# Patient Record
Sex: Female | Born: 1945
Health system: Southern US, Community
[De-identification: ages and names within clinical notes are randomized; demographics above are authoritative.]

## PROBLEM LIST (undated history)

## (undated) DIAGNOSIS — K219 Gastro-esophageal reflux disease without esophagitis: Secondary | ICD-10-CM

## (undated) DIAGNOSIS — F32A Depression, unspecified: Secondary | ICD-10-CM

## (undated) DIAGNOSIS — F419 Anxiety disorder, unspecified: Secondary | ICD-10-CM

## (undated) DIAGNOSIS — M1990 Unspecified osteoarthritis, unspecified site: Secondary | ICD-10-CM

## (undated) DIAGNOSIS — M3500 Sicca syndrome, unspecified: Secondary | ICD-10-CM

## (undated) HISTORY — DX: Unspecified osteoarthritis, unspecified site: M19.90

## (undated) HISTORY — DX: Gastro-esophageal reflux disease without esophagitis: K21.9

## (undated) HISTORY — PX: TOTAL ABDOMINAL HYSTERECTOMY: SHX209

## (undated) HISTORY — DX: Sjogren syndrome, unspecified: M35.00

---

## 2002-10-17 ENCOUNTER — Ambulatory Visit (HOSPITAL_COMMUNITY): Admission: RE | Admit: 2002-10-17 | Discharge: 2002-10-17 | Payer: Self-pay | Admitting: Internal Medicine

## 2004-05-13 ENCOUNTER — Ambulatory Visit (HOSPITAL_COMMUNITY): Admission: RE | Admit: 2004-05-13 | Discharge: 2004-05-13 | Payer: Self-pay | Admitting: Internal Medicine

## 2004-06-18 ENCOUNTER — Ambulatory Visit (HOSPITAL_COMMUNITY): Admission: RE | Admit: 2004-06-18 | Discharge: 2004-06-18 | Payer: Self-pay | Admitting: Unknown Physician Specialty

## 2005-04-27 ENCOUNTER — Ambulatory Visit: Payer: Self-pay | Admitting: Internal Medicine

## 2006-11-25 ENCOUNTER — Ambulatory Visit: Payer: Self-pay | Admitting: Internal Medicine

## 2007-02-13 ENCOUNTER — Encounter: Admission: RE | Admit: 2007-02-13 | Discharge: 2007-02-13 | Payer: Self-pay | Admitting: Orthopedic Surgery

## 2007-03-11 ENCOUNTER — Ambulatory Visit: Payer: Self-pay | Admitting: Internal Medicine

## 2012-04-29 ENCOUNTER — Other Ambulatory Visit (INDEPENDENT_AMBULATORY_CARE_PROVIDER_SITE_OTHER): Payer: Self-pay | Admitting: Internal Medicine

## 2012-05-02 ENCOUNTER — Telehealth (INDEPENDENT_AMBULATORY_CARE_PROVIDER_SITE_OTHER): Payer: Self-pay | Admitting: *Deleted

## 2012-05-02 NOTE — Telephone Encounter (Addendum)
Please call patient's refill for Omeprazole in. She uses [facility] in [place] at [phone]. Novia scheduled an apt with Dorene Ar for 05/05/12.   This has been taken care of

## 2012-05-05 ENCOUNTER — Encounter (INDEPENDENT_AMBULATORY_CARE_PROVIDER_SITE_OTHER): Payer: Self-pay | Admitting: Internal Medicine

## 2012-05-05 ENCOUNTER — Ambulatory Visit (INDEPENDENT_AMBULATORY_CARE_PROVIDER_SITE_OTHER): Payer: MEDICARE | Admitting: Internal Medicine

## 2012-05-05 VITALS — BP 100/80 | HR 72 | Temp 98.5°F | Ht 64.0 in | Wt 166.5 lb

## 2012-05-05 DIAGNOSIS — K219 Gastro-esophageal reflux disease without esophagitis: Secondary | ICD-10-CM

## 2012-05-05 NOTE — Patient Instructions (Signed)
Continue Omeprazole daily. OV in 1 yr

## 2012-05-05 NOTE — Progress Notes (Signed)
Subjective:     Patient ID: Kayla Berg, female   DOB: 1946/09/26, 66 y.o.   MRN: [id number]  Kayla Berg is a 66 yr old female here today for f/u. She tells me her acid reflux is controlled with the Prilosec for the most part. She will have a flare of acid reflux if she eats Spaghetti.  Appetite is good. No weight loss. No abdominal pain. She usually has a BM x 1 day  No melena or bright red rectal bleeding.   Review of Systemssee hpi Current Outpatient Prescriptions  Medication Sig Dispense Refill  . calcium carbonate (OS-CAL) 600 MG TABS Take 600 mg by mouth 2 (two) times daily with a meal.      . chlordiazePOXIDE (LIBRIUM) 25 MG capsule Take 25 mg by mouth 3 (three) times daily as needed.      . cholecalciferol (VITAMIN D) 400 UNITS TABS Take by mouth.      Marland Kitchen FLUoxetine (PROZAC) 20 MG capsule Take 20 mg by mouth daily.      . hydroxychloroquine (PLAQUENIL) 200 MG tablet Take by mouth 2 (two) times daily before a meal.      . multivitamin-iron-minerals-folic acid (CENTRUM) chewable tablet Chew 1 tablet by mouth daily.       Past Medical History  Diagnosis Date  . GERD (gastroesophageal reflux disease)   . Arthritis    Past Surgical History  Procedure Date  . Total abdominal hysterectomy    History   Social History  . Marital Status: Married    Spouse Name: N/A    Number of Children: N/A  . Years of Education: N/A   Occupational History  . Not on file.   Social History Main Topics  . Smoking status: Never Smoker   . Smokeless tobacco: Not on file  . Alcohol Use: No  . Drug Use: No  . Sexually Active: Not on file   Other Topics Concern  . Not on file   Social History Narrative  . No narrative on file   Family Status  Relation Status Death Age  . Mother Deceased     CAD, DM  . Father Deceased     Alzheimer's  . Sister Alive     CAD, Kidney problems, HTN, DM  . Brother Deceased     One had lung cancer. One died from CVA, One is alive  with intestinal cancer    Allergies  Allergen Reactions  . Codeine   . Sulfa Antibiotics        Objective:   Physical Exam Filed Vitals:   05/05/12 1152  Height: 5\' 4"  (1.626 m)  Weight: 166 lb 8 oz (75.524 kg)  Alert and oriented. Skin warm and dry. Oral mucosa is moist.   . Sclera anicteric, conjunctivae is pink. Thyroid not enlarged. No cervical lymphadenopathy. Lungs clear. Heart regular rate and rhythm.  Abdomen is soft. Bowel sounds are positive. No hepatomegaly. No abdominal masses felt. No tenderness.  No edema to lower extremities. Patient is alert and oriented.       Assessment:    GERD well controlled this time with Omeprazole. No GI problems.    Plan:      OV in one year.

## 2013-05-03 ENCOUNTER — Other Ambulatory Visit: Payer: Self-pay | Admitting: *Deleted

## 2013-05-03 ENCOUNTER — Encounter: Payer: Self-pay | Admitting: Neurology

## 2013-05-03 ENCOUNTER — Ambulatory Visit (INDEPENDENT_AMBULATORY_CARE_PROVIDER_SITE_OTHER): Payer: Medicare Other | Admitting: Neurology

## 2013-05-03 VITALS — BP 130/82 | HR 78 | Temp 97.6°F | Ht 63.0 in | Wt 165.0 lb

## 2013-05-03 DIAGNOSIS — H8110 Benign paroxysmal vertigo, unspecified ear: Secondary | ICD-10-CM

## 2013-05-03 DIAGNOSIS — G3184 Mild cognitive impairment, so stated: Secondary | ICD-10-CM

## 2013-05-03 NOTE — Patient Instructions (Addendum)
I think overall you are doing fairly well but I do want to suggest a few things today:  As far as diagnostic testing: MRI brain and labs: blood work.  Remember to drink plenty of fluid, eat healthy meals and do not skip any meals. Try to eat protein with a every meal and eat a healthy snack such as fruit or nuts in between meals. Try to keep a regular sleep-wake schedule and try to exercise daily, particularly in the form of walking, 20-30 minutes a day, if you can.   Engage in social activities in your community and with your family and try to keep up with current events by reading the newspaper or watching the news.   As far as your medications are concerned, I would like to suggest no change.   I would like to see you back in 3 months, sooner if we need to. Please call us with any interim questions, concerns, problems, updates or refill requests.  Please also call us for any test results so we can go over those with you on the phone. Brett Canales is my clinical assistant and will answer any of your questions and relay your messages to me and also relay most of my messages to you.  Our phone number is [phone]. We also have an after hours call service for urgent matters and there is a physician on-call for urgent questions. For any emergencies you know to call 911 or go to the nearest emergency room.

## 2013-05-03 NOTE — Progress Notes (Signed)
Subjective:    Patient ID: Kayla Berg is a 67 y.o. female.  HPI  Huston Foley, MD, PhD [facility] [address] [address] [place], [place] [address]   Dear Dr. Margo Common,  I saw your patient, Kayla Berg, upon your kind request in my neurologic clinic today for initial consultation of her vertigo. The patient is unaccompanied today. As you know, Kayla Berg is a very pleasant 67 year old right-handed lady with an underlying medical history of Sjogren's disease diagnosed in November 1999, reflux disease, and arthritis, status post arthroscopic right knee surgery in July 2005, who has been experiencing intermittent vertiginous symptoms for the past 6-8 months. She describes a sense of spinning specially with change in posture. It seems to be worse when she is laying back in her recliner. She was given meclizine which has been helpful to some degree. She has fallen in March without head injury or loss of consciousness. She fell at home while carrying her groceries into her house and fell onto some cans, but does not remember exactly, if she tripped or had a vertigo attack. Currently, her problems with vertigo are almost daily, but some days are worse than others. Right now, she is without Sx regarding vertigo. She has not taken in the last few weeks.  Her current medications are meclizine, Plaquenil, Prilosec, Estrace, Prozac 20 mg daily, chlordiazepoxide prn at night, and oxaprozin prn for back pain. She reports problems with her memory for about one year. She reports problems with forgetfulness and has had some mood irritability and anxiety. She feels fairly stable on Prozac. Denies hallucinations or delusions. She has a FHx of memory loss in her father and paternal FHx in uncles and aunts with memory loss. Father was Dx with what sounds like NPH.  She has never had a head CT or MRI brain.   Her Past Medical History Is Significant For: Past Medical History  Diagnosis Date   . GERD (gastroesophageal reflux disease)   . Arthritis     Her Past Surgical History Is Significant For: Past Surgical History  Procedure Laterality Date  . Total abdominal hysterectomy      Her Family History Is Significant For: History reviewed. No pertinent family history.  Her Social History Is Significant For: History   Social History  . Marital Status: Married    Spouse Name: Chrissie Noa    Number of Children: 3  . Years of Education: 10th   Occupational History  . retired    Social History Main Topics  . Smoking status: Never Smoker   . Smokeless tobacco: Never Used  . Alcohol Use: Yes     Comment: moderately - wine  . Drug Use: No  . Sexually Active: None   Other Topics Concern  . None   Social History Narrative   Pt lives at home with spouse.    Caffeine Use: very little    Her Allergies Are:  Allergies  Allergen Reactions  . Codeine   . Sulfa Antibiotics   :   Her Current Medications Are:  Outpatient Encounter Prescriptions as of 05/03/2013  Medication Sig Dispense Refill  . calcium carbonate (OS-CAL) 600 MG TABS Take 600 mg by mouth 2 (two) times daily with a meal.      . chlordiazePOXIDE (LIBRIUM) 25 MG capsule Take 25 mg by mouth 3 (three) times daily as needed.      . cholecalciferol (VITAMIN D) 400 UNITS TABS Take by mouth.      Marland Kitchen  ESTRACE VAGINAL 0.1 MG/GM vaginal cream       . FLUoxetine (PROZAC) 20 MG capsule Take 20 mg by mouth daily.      . hydroxychloroquine (PLAQUENIL) 200 MG tablet Take by mouth 2 (two) times daily before a meal.      . ibuprofen (ADVIL,MOTRIN) 200 MG tablet Take 200 mg by mouth every 6 (six) hours as needed for pain.      . multivitamin-iron-minerals-folic acid (CENTRUM) chewable tablet Chew 1 tablet by mouth daily.      Marland Kitchen omeprazole (PRILOSEC) 20 MG capsule 1 mg daily.       . valACYclovir (VALTREX) 500 MG tablet 1 g daily.        No facility-administered encounter medications on file as of 05/03/2013.  :  Review of  Systems  Constitutional: Positive for fatigue.  HENT:       Spinning sensation  Respiratory: Positive for cough.        Snoring  Musculoskeletal: Positive for joint swelling.       Joint pain, aching muslces  Allergic/Immunologic: Positive for environmental allergies.       Runny nose, skin sensitivity  Neurological:       Memory loss, confusion, snoring  Hematological: Bruises/bleeds easily.  Psychiatric/Behavioral:       Decreased energy, depression, anxiety    Objective:  Neurologic Exam  Physical Exam Physical Examination:   Filed Vitals:   05/03/13 1354  BP: 130/82  Pulse: 78  Temp: 97.6 F (36.4 C)    General Examination: The patient is a very pleasant 67 y.o. female in no acute distress. She is calm and cooperative with the exam. She denies Auditory Hallucinations and Visual Hallucinations.   HEENT: Normocephalic, atraumatic, pupils are equal, round and reactive to light and accommodation. Funduscopic exam is normal with sharp disc margins noted. Extraocular tracking shows mild saccadic breakdown without nystagmus noted. Hearing is intact. Tympanic membranes are clear bilaterally. Face is symmetric with no facial masking and normal facial sensation. There is no lip, neck or jaw tremor. Neck is not rigid with intact passive ROM. There are no carotid bruits on auscultation. Oropharynx exam reveals mild mouth dryness. No significant airway crowding is noted. Mallampati is class II. Tongue protrudes centrally and palate elevates symmetrically. Dix-Halpike is negative.  Chest: is clear to auscultation without wheezing, rhonchi or crackles noted.  Heart: sounds are regular and normal without murmurs, rubs or gallops noted.   Abdomen: is soft, non-tender and non-distended with normal bowel sounds appreciated on auscultation.  Extremities: There is no pitting edema in the distal lower extremities bilaterally. Pedal pulses are intact.  Skin: is warm and dry with no trophic  changes noted.  Musculoskeletal: exam reveals no obvious joint deformities, tenderness or joint swelling or erythema.  Neurologically:  Mental status: The patient is awake and alert, paying good  attention. She is able to completely provide the history. She is oriented to: all 4 spheres. Her memory, attention, language and knowledge are mildly impaired. There is no aphasia, agnosia, apraxia or anomia. There is a mild degree of bradyphrenia. Speech is mildly hypophonic with no dysarthria noted. Mood is congruent and affect is blunted.  Her MMSE score is 26/30. CDT is 4/4. AFT (Animal Fluency Test) score is 11.   Cranial nerves are as described above under HEENT exam. In addition, shoulder shrug is normal with equal shoulder height noted.  Motor exam: Normal bulk, and strength for age is noted. Tone is not rigid with absence of  cogwheeling in the extremities. There is overall no bradykinesia. There is no drift or rebound. There is no tremor. Romberg is negative. Reflexes are 1+ in the upper extremities and 1+ in the lower extremities. Toes are downgoing bilaterally. Fine motor skills: Finger taps, hand movements, and rapid alternating patting are not impaired bilaterally. Foot taps and foot agility are not impaired bilaterally.   Cerebellar testing shows no dysmetria or intention tremor on finger to nose testing. Heel to shin is unremarkable. There is no truncal or gait ataxia.   Sensory exam is intact to light touch, pinprick, vibration, temperature sense and proprioception in the upper and lower extremities.   Gait, station and balance: She stands up from the seated position with no difficulty. No veering to one side is noted. No leaning to one side. Posture is not stooped. Stance is narrow-based. She turns en bloc. Tandem walk is not possible. Balance is not impaired.     Assessment and Plan:   Assessment and Plan:  In summary, Kayla Berg is a very pleasant 67 y.o.-year old female with a  history of vertigo and . Her physical exam is stable and non-focal for the most part. She did score 26/30 on the MMSE, indicating mild cognitive impairment. I had a long chat with the patient about my findings and the diagnosis of Vertigo and MCI. She has no vertiginous Sx at this time. Nevertheless, her Hx and exam are most c/w paroxysmal positional vertigo. We talked about medical treatments and non-pharmacological approaches for memory loss and for vertigo. She does not want to pursue vestibular rehab at this time and can continue to take her meclizine prn. We talked about maintaining a healthy lifestyle in general. I encouraged the patient to eat healthy, exercise daily and keep well hydrated, to keep a scheduled bedtime and wake time routine, to not skip any meals and eat healthy snacks in between meals and to have protein with every meal.   As far as further diagnostic testing is concerned, I suggested the following today: MRI brain w and w/o Gad and labs: CBC, TSH, CMP, vit D, rpr.   As far as medications are concerned, I recommended the following at this time: no change.  I answered all her questions today and the patient was in agreement with the above outlined plan. I would like to see the patient back in 3 months, sooner if the need arises and encouraged her to call with any interim questions, concerns, problems or updates and test results.   Thank you very much for allowing me to participate in the care of this nice patient. If I can be of any further assistance to you please do not hesitate to call me at [phone].  Sincerely,   Huston Foley, MD, PhD

## 2013-05-04 LAB — COMPREHENSIVE METABOLIC PANEL
Albumin/Globulin Ratio: 1.6 (ref 1.1–2.5)
Albumin: 4.1 g/dL (ref 3.6–4.8)
BUN/Creatinine Ratio: 15 (ref 11–26)
CO2: 25 mmol/L (ref 19–28)
Calcium: 9.7 mg/dL (ref 8.6–10.2)
Chloride: 104 mmol/L (ref 97–108)
Creatinine, Ser: 0.88 mg/dL (ref 0.57–1.00)
GFR calc Af Amer: 78 mL/min/{1.73_m2} (ref >59)
GFR calc non Af Amer: 68 mL/min/{1.73_m2} (ref >59)
Potassium: 4.5 mmol/L (ref 3.5–5.2)
Sodium: 141 mmol/L (ref 134–144)
Total Bilirubin: 0.4 mg/dL (ref 0.0–1.2)

## 2013-05-04 LAB — CBC WITH DIFFERENTIAL
Basos: 0 % (ref 0–3)
Eos: 2 % (ref 0–5)
Eosinophils Absolute: 0.1 10*3/uL (ref 0.0–0.4)
HCT: 39 % (ref 34.0–46.6)
Immature Grans (Abs): 0 10*3/uL (ref 0.0–0.1)
Lymphs: 44 % (ref 14–46)
MCH: 31.1 pg (ref 26.6–33.0)
MCHC: 34.1 g/dL (ref 31.5–35.7)
MCV: 91 fL (ref 79–97)
Monocytes: 8 % (ref 4–12)
Neutrophils Relative %: 46 % (ref 40–74)

## 2013-05-04 NOTE — Progress Notes (Signed)
Quick Note:  Please call and advise the patient that the recent labs we checked were within normal limits, except her TSH was 0.374 which is a little low. The normal cutoff for low is 0.45. This may mean that she has a thyroid dysfunction. Please have her discuss this with her primary care physician. She may need further testing. Otherwise her vitamin D level, blood sugar, electrolytes, kidney function, liver function, which were normal. Please remind patient to keep any upcoming appointments and to call us with any interim questions, concerns, problems or updates. Thanks,  Huston Foley, MD, PhD    ______

## 2013-05-04 NOTE — Progress Notes (Signed)
Quick Note:  Spoke with patient and relayed results of blood work. Patient understood and had no questions.  ______ 

## 2013-05-08 ENCOUNTER — Ambulatory Visit
Admission: RE | Admit: 2013-05-08 | Discharge: 2013-05-08 | Disposition: A | Discharge status: Home / Self Care | Payer: Medicare Other | Source: Ambulatory Visit | Attending: Neurology | Admitting: Neurology

## 2013-05-08 DIAGNOSIS — G3184 Mild cognitive impairment, so stated: Secondary | ICD-10-CM

## 2013-05-08 DIAGNOSIS — R42 Dizziness and giddiness: Secondary | ICD-10-CM

## 2013-05-08 DIAGNOSIS — H8110 Benign paroxysmal vertigo, unspecified ear: Secondary | ICD-10-CM

## 2013-05-08 MED ORDER — GADOBENATE DIMEGLUMINE 529 MG/ML IV SOLN
10.0000 mL | Freq: Once | INTRAVENOUS | Status: AC | PRN
  Administered 2013-05-08: 10 mL via INTRAVENOUS

## 2013-05-10 ENCOUNTER — Encounter (INDEPENDENT_AMBULATORY_CARE_PROVIDER_SITE_OTHER): Payer: Self-pay | Admitting: *Deleted

## 2013-05-12 NOTE — Progress Notes (Signed)
Quick Note:  Please call and advise the patient that the recent scan we did was within the expected findings: We did a brain MRI with and without contrast and there were no acute findings, such as a stroke, or mass or blood products. There were mild sulcal right matter changes which are nonspecific findings and can be seen with normal aging, chronic hypertension, chronic diabetes, chronic hyperlipidemia, and in patients with chronic headaches. No further action is required on this test at this time. Please remind patient to keep any upcoming appointments or tests and to call us with any interim questions, concerns, problems or updates. Thanks,  Huston Foley, MD, PhD   ______

## 2013-05-12 NOTE — Progress Notes (Signed)
Quick Note:  Spoke with patient and relayed the results of their MRI as well as Dr Athar's advise or recommendations. The patient was also reminded of any future appointments. The patient understood and had no questions.  ______ 

## 2013-05-13 ENCOUNTER — Other Ambulatory Visit: Payer: MEDICARE

## 2013-05-25 ENCOUNTER — Encounter (INDEPENDENT_AMBULATORY_CARE_PROVIDER_SITE_OTHER): Payer: Self-pay | Admitting: Internal Medicine

## 2013-05-25 ENCOUNTER — Ambulatory Visit (INDEPENDENT_AMBULATORY_CARE_PROVIDER_SITE_OTHER): Payer: Medicare Other | Admitting: Internal Medicine

## 2013-05-25 VITALS — BP 108/60 | HR 80 | Ht 63.5 in | Wt 163.5 lb

## 2013-05-25 DIAGNOSIS — K219 Gastro-esophageal reflux disease without esophagitis: Secondary | ICD-10-CM

## 2013-05-25 MED ORDER — OMEPRAZOLE 40 MG PO CPDR: 40.0000 mg | DELAYED_RELEASE_CAPSULE | Freq: Every day | ORAL | Status: DC

## 2013-05-25 NOTE — Progress Notes (Signed)
Subjective:     Patient ID: Kayla Berg, female   DOB: 1946/07/02, 67 y.o.   MRN: [id number]  HPI Here today for f/u of her acid reflux. She tells me the omeprazole is not working as well as the Nexium. She has a small amt of acid reflux. She does have a cough at night. Appetite is okay. No weight loss.  No abdominal pain. Her BMs are normal.  She is not exercising.    Review of Systems     Current Outpatient Prescriptions  Medication Sig Dispense Refill  . calcium carbonate (OS-CAL) 600 MG TABS Take 600 mg by mouth 2 (two) times daily with a meal.      . chlordiazePOXIDE (LIBRIUM) 25 MG capsule Take 25 mg by mouth 3 (three) times daily as needed.      . cholecalciferol (VITAMIN D) 400 UNITS TABS Take by mouth.      Marland Kitchen Cod Liver Oil 1000 MG CAPS Take by mouth.      Andrey Campanile VAGINAL 0.1 MG/GM vaginal cream       . FLUoxetine (PROZAC) 20 MG capsule Take 20 mg by mouth daily.      . hydroxychloroquine (PLAQUENIL) 200 MG tablet Take by mouth 2 (two) times daily before a meal.      . ibuprofen (ADVIL,MOTRIN) 200 MG tablet Take 200 mg by mouth every 6 (six) hours as needed for pain.      Marland Kitchen ibuprofen (ADVIL,MOTRIN) 200 MG tablet Take 200 mg by mouth every 6 (six) hours as needed for pain.      . multivitamin-iron-minerals-folic acid (CENTRUM) chewable tablet Chew 1 tablet by mouth daily.      . valACYclovir (VALTREX) 500 MG tablet 1 g daily.       Marland Kitchen omeprazole (PRILOSEC) 40 MG capsule Take 1 capsule (40 mg total) by mouth daily.  90 capsule  3   No current facility-administered medications for this visit.   Past Medical History  Diagnosis Date  . GERD (gastroesophageal reflux disease)   . Arthritis   . Sjoegren syndrome    Past Surgical History  Procedure Laterality Date  . Total abdominal hysterectomy     Allergies  Allergen Reactions  . Codeine   . Sulfa Antibiotics     Objective:   Physical Exam  Filed Vitals:   05/25/13 1122  BP: 108/60  Pulse: 80  Height: 5' 3.5"  (1.613 m)  Weight: 163 lb 8 oz (74.163 kg)  Alert and oriented. Skin warm and dry. Oral mucosa is moist.   . Sclera anicteric, conjunctivae is pink. Thyroid not enlarged. No cervical lymphadenopathy. Lungs clear. Heart regular rate and rhythm.  Abdomen is soft. Bowel sounds are positive. No hepatomegaly. No abdominal masses felt. No tenderness.  No edema to lower extremities.       Assessment:    GERD for the most part she is controlled with Prilosec.     Plan:    Prilosec 40mg  daily., OV in 1 yr.

## 2013-05-25 NOTE — Patient Instructions (Addendum)
Prilosec 40mg  daily. OV in 1 yr

## 2013-08-03 ENCOUNTER — Encounter: Payer: Self-pay | Admitting: Neurology

## 2013-08-03 ENCOUNTER — Ambulatory Visit (INDEPENDENT_AMBULATORY_CARE_PROVIDER_SITE_OTHER): Payer: Medicare Other | Admitting: Neurology

## 2013-08-03 VITALS — BP 121/78 | HR 85 | Temp 97.3°F | Ht 63.0 in | Wt 164.0 lb

## 2013-08-03 DIAGNOSIS — R519 Headache, unspecified: Secondary | ICD-10-CM

## 2013-08-03 DIAGNOSIS — H8110 Benign paroxysmal vertigo, unspecified ear: Secondary | ICD-10-CM

## 2013-08-03 DIAGNOSIS — R413 Other amnesia: Secondary | ICD-10-CM

## 2013-08-03 DIAGNOSIS — R209 Unspecified disturbances of skin sensation: Secondary | ICD-10-CM

## 2013-08-03 DIAGNOSIS — R202 Paresthesia of skin: Secondary | ICD-10-CM

## 2013-08-03 DIAGNOSIS — R51 Headache: Secondary | ICD-10-CM

## 2013-08-03 NOTE — Progress Notes (Signed)
Subjective:    Patient ID: Kayla Berg is a 67 y.o. female.  HPI  Interim history:   Kayla Berg is a very pleasant 67 year old right-handed lady with an underlying medical history of Sjogren's disease diagnosed in November 1999, reflux disease, and arthritis, status post arthroscopic right knee surgery in July 2005, who presents for FU consultation of her vertigo. She is unaccompanied today. I first met her on 05/03/2013, and which time she represented with a 6-8 month history of vertiginous symptoms including a sense of spinning with change in posture. She had fallen in March without head injury or loss of consciousness. At the time of her first visit I felt that she most likely had vertigo and she had some memory loss as well. I suggested a brain MRI with and without contrast and labs. Her labs were within normal limits with the exception of a slightly low TSH at 0.374. Several years ago, perhaps as long as 20 years ago, she had issues with goiter and hyperthyroidism, was placed on medications, including Inderal.  Her brain MRI from 05/08/13 showed: 1. Mild periventricular and left parietal 2mm foci of non-specific gliosis. No abnormal lesions are seen on post contrast views. These findings are non-specific and considerations include autoimmune, inflammatory, post-infectious, microvascular ischemic or migraine associated etiologies. 2. No acute findings.  She has had intermittent R posterior neck pain. She does have a hx of degenerative L spine d/s. She has had intermittent tingling in the RUE.  She feels her memory is stable. She feels her vertigo is improved. She tries to stay well-hydrated.  Her Past Medical History Is Significant For: Past Medical History  Diagnosis Date  . GERD (gastroesophageal reflux disease)   . Arthritis   . Sjoegren syndrome     Her Past Surgical History Is Significant For: Past Surgical History  Procedure Laterality Date  . Total abdominal hysterectomy      Her  Family History Is Significant For: No family history on file.  Her Social History Is Significant For: History   Social History  . Marital Status: Married    Spouse Name: Kayla Berg    Number of Children: 3  . Years of Education: 10th   Occupational History  . retired    Social History Main Topics  . Smoking status: Never Smoker   . Smokeless tobacco: Never Used  . Alcohol Use: Yes     Comment: moderately - wine  . Drug Use: No  . Sexually Active: None   Other Topics Concern  . None   Social History Narrative   Pt lives at home with spouse.    Caffeine Use: very little    Her Allergies Are:  Allergies  Allergen Reactions  . Codeine   . Sulfa Antibiotics   :   Her Current Medications Are:  Outpatient Encounter Prescriptions as of 08/03/2013  Medication Sig Dispense Refill  . calcium carbonate (OS-CAL) 600 MG TABS Take 600 mg by mouth 2 (two) times daily with a meal.      . chlordiazePOXIDE (LIBRIUM) 25 MG capsule Take 25 mg by mouth 3 (three) times daily as needed.      . cholecalciferol (VITAMIN D) 400 UNITS TABS Take by mouth.      Marland Kitchen Cod Liver Oil 1000 MG CAPS Take by mouth.      Andrey Campanile VAGINAL 0.1 MG/GM vaginal cream       . FLUoxetine (PROZAC) 20 MG capsule Take 20 mg by mouth daily.      Marland Kitchen  hydroxychloroquine (PLAQUENIL) 200 MG tablet Take by mouth 2 (two) times daily before a meal.      . ibuprofen (ADVIL,MOTRIN) 200 MG tablet Take 200 mg by mouth every 6 (six) hours as needed for pain.      Marland Kitchen ibuprofen (ADVIL,MOTRIN) 200 MG tablet Take 200 mg by mouth every 6 (six) hours as needed for pain.      . multivitamin-iron-minerals-folic acid (CENTRUM) chewable tablet Chew 1 tablet by mouth daily.      Marland Kitchen omeprazole (PRILOSEC) 40 MG capsule Take 1 capsule (40 mg total) by mouth daily.  90 capsule  3  . valACYclovir (VALTREX) 500 MG tablet 1 g daily.        No facility-administered encounter medications on file as of 08/03/2013.  : Review of Systems  Musculoskeletal:  Positive for arthralgias (neck muscle).  Neurological: Positive for dizziness.    Objective:  Neurologic Exam  Physical Exam Physical Examination:   Filed Vitals:   08/03/13 1553  BP: 121/78  Pulse: 85  Temp: 97.3 F (36.3 C)    General Examination: The patient is a very pleasant 67 y.o. female in no acute distress. She appears well-developed and well-nourished and well groomed.   HEENT: Normocephalic, atraumatic, pupils are equal, round and reactive to light and accommodation. Funduscopic exam is normal with sharp disc margins noted. Extraocular tracking shows mild saccadic breakdown without nystagmus noted. Hearing is intact. Tympanic membranes are clear bilaterally. Face is symmetric with no facial masking and normal facial sensation. There is no lip, neck or jaw tremor. Neck is not rigid with intact passive ROM. There are no carotid bruits on auscultation. Oropharynx exam reveals mild mouth dryness. No significant airway crowding is noted. Mallampati is class II. Tongue protrudes centrally and palate elevates symmetrically. Dix-Halpike is negative.  Chest: is clear to auscultation without wheezing, rhonchi or crackles noted.  Heart: sounds are regular and normal without murmurs, rubs or gallops noted.   Abdomen: is soft, non-tender and non-distended with normal bowel sounds appreciated on auscultation.  Extremities: There is no pitting edema in the distal lower extremities bilaterally. Pedal pulses are intact.  Skin: is warm and dry with no trophic changes noted.  Musculoskeletal: exam reveals no obvious joint deformities, tenderness or joint swelling or erythema.  Neurologically:  Mental status: The patient is awake and alert, paying good  attention. She is able to completely provide the history. She is oriented to: all 4 spheres. Her memory, attention, language and knowledge are mildly impaired. There is no aphasia, agnosia, apraxia or anomia. There is a mild degree of  bradyphrenia. Speech is mildly hypophonic with no dysarthria noted. Mood is congruent and affect is fairly normal today.  On 05/03/13: MMSE score of 26/30. CDT is 4/4. AFT (Animal Fluency Test) score is 11.   Cranial nerves are as described above under HEENT exam. In addition, shoulder shrug is normal with equal shoulder height noted.  Motor exam: Normal bulk, and strength for age is noted. Tone is not rigid with absence of cogwheeling in the extremities. There is overall no bradykinesia. There is no drift or rebound. There is no tremor. Romberg is negative. Reflexes are 1+ in the upper extremities and 1+ in the lower extremities. Toes are downgoing bilaterally. Fine motor skills: Finger taps, hand movements, and rapid alternating patting are not impaired bilaterally. Foot taps and foot agility are not impaired bilaterally.   Cerebellar testing shows no dysmetria or intention tremor on finger to nose testing. Heel to shin  is unremarkable. There is no truncal or gait ataxia.   Sensory exam is intact to light touch, pinprick, vibration, temperature sense and proprioception in the upper and lower extremities.   Gait, station and balance: She stands up from the seated position with no difficulty. No veering to one side is noted. No leaning to one side. Posture is not stooped. Stance is narrow-based. She turns en bloc. Tandem walk is good today. Balance is not impaired.     Assessment and Plan:   Assessment and Plan:  In summary, Kayla Berg is a very pleasant 67 y.o.-year old female with a history of vertigo. Her physical exam is stable and non-focal. She has had some mild memory loss, but feels stable. She has had infrequent neck pain and I suggested that if this flares up or becomes more consistent we may have to do a cervical spine MRI. I talked to her about her blood work. Of note she has a physical checkup with her primary care physician tomorrow and is advised to bring up her thyroid disease history  with him. Her TSH was a little bit low last time 3 months ago. She may need it rechecked. She was asking about her cholesterol values which I did not check them I suggested that she get it checked through her primary care physician. She was in agreement. Her vertigo is not very bothersome at this time and I suggested that if this flares up we can always try a round of physical therapy. Since she is stable I suggested a six-month followup. She is encouraged to call with any interim questions, concerns, problems or updates.

## 2013-08-03 NOTE — Patient Instructions (Addendum)
I think overall you are doing fairly well and are stable at this point.   I do have some generic suggestions for you today:  Please make sure that you drink plenty of fluids. I would like for you to exercise daily for example in the form of walking 20-30 minutes every day, if you can. Please keep a regular sleep-wake schedule, keep regular meal times, do not skip any meals, eat  healthy snacks in between meals, such as fruit or nuts. Try to eat protein with every meal.   As far as your medications are concerned, I would like to suggest:    As far as diagnostic testing, I recommend: no new test, but please discuss your thyroid issue with him tomorrow. Your TSH 3 months ago was a little low at 0.374.   Change position slowly, if your vertigo flares up, we can send you to physical therapy.   I do not think we need to make any changes in your medications at this point. I think you're stable enough that I can see you back in 6 months, sooner if we need to. Please call us if you have any interim questions, concerns, or problems or updates to need to discuss.  Brett Canales is my clinical assistant and will answer any of your questions and relay your messages to me and will give you my messages.   Our phone number is [phone]. We also have an after hours call service for urgent matters and there is a physician on-call for urgent questions. For any emergencies you know to call 911 or go to the nearest emergency room.

## 2014-02-05 ENCOUNTER — Ambulatory Visit: Payer: Medicare Other | Admitting: Neurology

## 2014-03-06 ENCOUNTER — Encounter (INDEPENDENT_AMBULATORY_CARE_PROVIDER_SITE_OTHER): Payer: Self-pay | Admitting: *Deleted

## 2014-04-30 ENCOUNTER — Encounter: Payer: Self-pay | Admitting: Orthopedic Surgery

## 2014-05-17 ENCOUNTER — Ambulatory Visit (INDEPENDENT_AMBULATORY_CARE_PROVIDER_SITE_OTHER): Payer: Medicare Other | Admitting: Orthopedic Surgery

## 2014-05-17 VITALS — BP 124/69

## 2014-05-17 DIAGNOSIS — IMO0002 Reserved for concepts with insufficient information to code with codable children: Secondary | ICD-10-CM

## 2014-05-17 DIAGNOSIS — M179 Osteoarthritis of knee, unspecified: Secondary | ICD-10-CM

## 2014-05-17 DIAGNOSIS — M1710 Unilateral primary osteoarthritis, unspecified knee: Secondary | ICD-10-CM | POA: Insufficient documentation

## 2014-05-17 NOTE — Progress Notes (Addendum)
Patient ID: Kayla Berg, female   DOB: September 05, 1946, 68 y.o.   MRN: [id number]  Chief complaint joint pain left greater than right knee pain  This patient had previous knee arthroscopy was previously followed by [facility] then Dr. Percell Miller and Nicholaus Bloom. Presents for transfer of care and reevaluation left knee status post recent injection by her rheumatologist complains of only mild constant aching at this time. Pain is noted when she is working in the yard or climbing steps which he does not do sequentially.  I have all of her previous records I reviewed them she's basically had knee arthroscopy with meniscectomy and treatment for arthritis. Both knees have an arthroscopic surgery. Her past medical history is notable for depression and arthritis  Her medications are omeprazole hydrochlorothiazide fluoxetine for diazepam multivitamins and valacyclovir along with calcium vitamin D ibuprofen and eyedrops  Allergy to codeine and sulfa  Social history no street drugs  Family history diabetes arthritis depression  Family members are deceased mother and father at 46 respectively brothers and sisters are alive. Review of systems negative except for heartburn joint pain limb pain difficulty moving the leg back pain and bilateral leg burning  Eyes skin neuroendocrine cancer genitourinary mental health issues negative  She had an x-ray done in 2014 showed moderate arthritis definitely not surgical at that point  VS BP 124/69 Pulse 82 and respiratory rate 16 General and hygiene are normal. Development and nutrition are normal. Body habitus medium  Mood Affect are normal The patient is alert and oriented x3 Ambulatory status normal  RUE (include skin) Inspection reveals no tenderness or swelling,  range of motion is normal. The joints are located without subluxation or laxity. Motor exam reveals grade 5 strength and the skin is without rash lesion or ulcer  LUE Inspection reveals no tenderness or swelling,   range of motion is normal. The joints are located without subluxation or laxity. Motor exam reveals grade 5 strength;  skin is without rash lesion or ulcer  RLE Inspection reveals mild tenderness without swelling around the joint line;  range of motion is normal. The joints are located without subluxation or laxity. Motor exam reveals grade 5 strength and the skin is without rash lesion or ulcer  LLE Inspection reveals mild tenderness no swelling around the joint line,  range of motion is normal. The joints are located without subluxation or laxity. Motor exam reveals grade 5 strength and  the skin is without rash lesion or ulcer  CDV peripheral pulses are intact without swelling or varicose veins  LYMPH are normal in all 4 extremities with no palpable nodes  DTR are equal and symmetric Balance  is normal  Osteoarthritis of the knee  Continue nonoperative treatment return in 6 months for x-rays left knee  Call sooner if need another injection left knee or right knee

## 2014-06-04 ENCOUNTER — Encounter (INDEPENDENT_AMBULATORY_CARE_PROVIDER_SITE_OTHER): Payer: Self-pay | Admitting: Internal Medicine

## 2014-06-04 ENCOUNTER — Encounter (INDEPENDENT_AMBULATORY_CARE_PROVIDER_SITE_OTHER): Payer: Self-pay | Admitting: *Deleted

## 2014-06-04 ENCOUNTER — Ambulatory Visit (INDEPENDENT_AMBULATORY_CARE_PROVIDER_SITE_OTHER): Payer: Medicare Other | Admitting: Internal Medicine

## 2014-06-04 VITALS — BP 110/70 | HR 76 | Temp 98.5°F | Resp 18 | Ht 64.0 in | Wt 167.5 lb

## 2014-06-04 DIAGNOSIS — R140 Abdominal distension (gaseous): Secondary | ICD-10-CM

## 2014-06-04 DIAGNOSIS — R141 Gas pain: Secondary | ICD-10-CM

## 2014-06-04 DIAGNOSIS — R143 Flatulence: Secondary | ICD-10-CM

## 2014-06-04 DIAGNOSIS — R110 Nausea: Secondary | ICD-10-CM

## 2014-06-04 DIAGNOSIS — K219 Gastro-esophageal reflux disease without esophagitis: Secondary | ICD-10-CM

## 2014-06-04 DIAGNOSIS — R142 Eructation: Secondary | ICD-10-CM

## 2014-06-04 MED ORDER — PANTOPRAZOLE SODIUM 40 MG PO TBEC: 40.0000 mg | DELAYED_RELEASE_TABLET | Freq: Two times a day (BID) | ORAL | Status: DC

## 2014-06-04 MED ORDER — RANITIDINE HCL 150 MG PO TABS: 150.0000 mg | ORAL_TABLET | Freq: Every evening | ORAL | Status: DC | PRN

## 2014-06-04 NOTE — Patient Instructions (Signed)
Take Zantac or ranitidine 150 mg by mouth at bedtime on an as-needed basis. Physician will call with results of ultrasound.

## 2014-06-04 NOTE — Progress Notes (Signed)
Presenting complaint;  Followup for GERD. Patient complains of nausea and postprandial bloating.  Subjective:  Patient is 68 year old African American female who has chronic GERD and is here for scheduled visit. She was last seen in May 2014. At the time of last visit she was doing well with omeprazole. She says she's not feeling well. She has intermittent heartburn and regurgitation with bitter taste in her mouth at least once a week. She also complains of dry hacking cough usually at night and intermittent hoarseness. He has sporadic heartburn but denies dysphagia. Nausea is experienced at least 3 times a week and worse after meals. She also feels bloated and feels her food stays in her stomach for a long time. She denies melena or rectal bleeding. She has bilateral knee pain and she is taking OTC ibuprofen twice a day the Patient had EGD in October 2003 revealing small sliding type and antral gastritis. She had H. pylori serology which possibly was negative. Due to the biopsy revealed inflamed duodenal mucosa and  villous architecture was intact. Patient feels her Sjogren's syndrome remains under control with Plaquenil. She is on Librium for anxiety. She has gained 4 pounds since her last visit one year ago.  Current Medications: Outpatient Encounter Prescriptions as of 06/04/2014  Medication Sig  . calcium carbonate (OS-CAL) 600 MG TABS Take 600 mg by mouth 2 (two) times daily with a meal.  . chlordiazePOXIDE (LIBRIUM) 25 MG capsule Take 25 mg by mouth 3 (three) times daily as needed.  . cholecalciferol (VITAMIN D) 400 UNITS TABS Take by mouth daily.   Marland Kitchen FLUoxetine (PROZAC) 20 MG capsule Take 20 mg by mouth daily.  . hydroxychloroquine (PLAQUENIL) 200 MG tablet Take by mouth 2 (two) times daily before a meal.  . ibuprofen (ADVIL,MOTRIN) 200 MG tablet Take 200 mg by mouth every 6 (six) hours as needed for pain.  . multivitamin-iron-minerals-folic acid (CENTRUM) chewable tablet Chew 1 tablet by  mouth daily.  . Omega-3 Fatty Acids (FISH OIL) 1000 MG CAPS Take 1,000 mg by mouth daily.  Marland Kitchen omeprazole (PRILOSEC) 40 MG capsule Take 1 capsule (40 mg total) by mouth daily.  . ranitidine (ZANTAC) 150 MG tablet Take 150 mg by mouth at bedtime.  . valACYclovir (VALTREX) 500 MG tablet 1 g daily.   . [DISCONTINUED] Cod Liver Oil 1000 MG CAPS Take by mouth.  . [DISCONTINUED] ESTRACE VAGINAL 0.1 MG/GM vaginal cream   . [DISCONTINUED] ibuprofen (ADVIL,MOTRIN) 200 MG tablet Take 200 mg by mouth every 6 (six) hours as needed for pain.     Objective: Blood pressure 110/70, pulse 76, temperature 98.5 F (36.9 C), temperature source Oral, resp. rate 18, height 5\' 4"  (1.626 m), weight 167 lb 8 oz (75.978 kg). Patient is alert and in no acute distress. Conjunctiva is pink. Sclera is nonicteric Oropharyngeal mucosa is normal. No neck masses or thyromegaly noted. Cardiac exam with regular rhythm normal S1 and S2. No murmur or gallop noted. Lungs are clear to auscultation. Abdomen is symmetrical. Bowel sounds are normal. On palpation abdomen is soft and nontender with organomegaly or masses.  No LE edema or clubbing noted.    Assessment:  #1. GERD. She has had symptoms for 20 years. Last EGD was in October 2013 without evidence of erosive esophagitis or Barrett's. Symptoms are not well controlled with current therapy. #2. Nausea and bloating. Need to rule out biliary tract disease before considering workup for gastroparesis.   Plan:  Discontinue omeprazole. Pantoprazole 40 mg by mouth twice a  day. Upper abdominal ultrasound. Office visit in 2 months.

## 2014-06-07 ENCOUNTER — Ambulatory Visit (HOSPITAL_COMMUNITY)
Admission: RE | Admit: 2014-06-07 | Discharge: 2014-06-07 | Disposition: A | Discharge status: Home / Self Care | Payer: Medicare Other | Source: Ambulatory Visit | Attending: Internal Medicine | Admitting: Internal Medicine

## 2014-06-07 DIAGNOSIS — R110 Nausea: Secondary | ICD-10-CM

## 2014-06-07 DIAGNOSIS — R142 Eructation: Secondary | ICD-10-CM

## 2014-06-07 DIAGNOSIS — R141 Gas pain: Secondary | ICD-10-CM | POA: Insufficient documentation

## 2014-06-07 DIAGNOSIS — R140 Abdominal distension (gaseous): Secondary | ICD-10-CM

## 2014-06-07 DIAGNOSIS — R143 Flatulence: Secondary | ICD-10-CM

## 2014-06-11 ENCOUNTER — Ambulatory Visit (INDEPENDENT_AMBULATORY_CARE_PROVIDER_SITE_OTHER): Payer: Medicare Other | Admitting: Internal Medicine

## 2014-07-31 ENCOUNTER — Encounter (INDEPENDENT_AMBULATORY_CARE_PROVIDER_SITE_OTHER): Payer: Self-pay | Admitting: Internal Medicine

## 2014-07-31 ENCOUNTER — Ambulatory Visit (INDEPENDENT_AMBULATORY_CARE_PROVIDER_SITE_OTHER): Payer: Medicare Other | Admitting: Internal Medicine

## 2014-07-31 VITALS — BP 110/70 | HR 74 | Temp 97.4°F | Resp 18 | Ht 64.0 in | Wt 168.0 lb

## 2014-07-31 DIAGNOSIS — K3189 Other diseases of stomach and duodenum: Secondary | ICD-10-CM

## 2014-07-31 DIAGNOSIS — R1013 Epigastric pain: Secondary | ICD-10-CM

## 2014-07-31 DIAGNOSIS — K219 Gastro-esophageal reflux disease without esophagitis: Secondary | ICD-10-CM

## 2014-07-31 MED ORDER — PANTOPRAZOLE SODIUM 40 MG PO TBEC: 40.0000 mg | DELAYED_RELEASE_TABLET | Freq: Every day | ORAL | Status: DC

## 2014-07-31 NOTE — Patient Instructions (Signed)
Call if symptoms not well controlled with pantoprazole once daily. Can take Zantac or ranitidine for breakthrough symptoms or heartburn at night

## 2014-07-31 NOTE — Progress Notes (Signed)
Presenting complaint;  Followup for GERD nausea and postprandial bloating.  Data base;  Patient is 68 year old African female who is here for scheduled visit. She was last seen 8 weeks ago for symptoms of GERD nausea and postprandial bloating. Omeprazole was discontinued and she was begun on pantoprazole. She had upper abdominal ultrasound on 06/07/2014 and was within normal limits. She now presents for followup visit.  Subjective;  Patient feels much better. Nausea has resolved. She is having heartburn no more than 2-3 times a month. She is still having bloating but now is occasional and not as pronounced. She denies abdominal pain. Her bowels move daily. She also denies melena or rectal bleeding. She is not having any side effects with pantoprazole. She takes no more than 15 doses of ibuprofen in a month.     Current Medications: Outpatient Encounter Prescriptions as of 07/31/2014  Medication Sig  . calcium carbonate (OS-CAL) 600 MG TABS Take 600 mg by mouth 2 (two) times daily with a meal.  . chlordiazePOXIDE (LIBRIUM) 25 MG capsule Take 25 mg by mouth 3 (three) times daily as needed.  . cholecalciferol (VITAMIN D) 400 UNITS TABS Take by mouth 2 (two) times daily.   Marland Kitchen FLUoxetine (PROZAC) 20 MG capsule Take 20 mg by mouth daily.  . hydroxychloroquine (PLAQUENIL) 200 MG tablet Take by mouth 2 (two) times daily before a meal.  . ibuprofen (ADVIL,MOTRIN) 200 MG tablet Take 200 mg by mouth every 6 (six) hours as needed for pain.  . multivitamin-iron-minerals-folic acid (CENTRUM) chewable tablet Chew 1 tablet by mouth daily.  . pantoprazole (PROTONIX) 40 MG tablet Take 1 tablet (40 mg total) by mouth 2 (two) times daily before a meal.  . ranitidine (ZANTAC) 150 MG tablet Take 1 tablet (150 mg total) by mouth at bedtime as needed for heartburn.  . valACYclovir (VALTREX) 500 MG tablet 1 g daily.   . [DISCONTINUED] Omega-3 Fatty Acids (FISH OIL) 1000 MG CAPS Take 1,000 mg by mouth daily.     Objective: Blood pressure 110/70, pulse 74, temperature 97.4 F (36.3 C), temperature source Oral, resp. rate 18, height 5\' 4"  (1.626 m), weight 168 lb (76.204 kg). Patient is alert and in no acute distress. Conjunctiva is pink. Sclera is nonicteric Oropharyngeal mucosa is normal. No neck masses or thyromegaly noted. Cardiac exam with regular rhythm normal S1 and S2. No murmur or gallop noted. Lungs are clear to auscultation. Abdomen is symmetrical and soft with mild epigastric tenderness but no organomegaly or masses.  No LE edema or clubbing noted.  Labs/studies Results:  Upper abdominal ultrasound on 06/07/2014. No gallstones or wall thickening. Bile duct measures 4.5 mm . No abnormality noted the liver kidneys or spleen. Part of the pancreas that was seen was normal.  Aorta without aneurysmal dilation.  Assessment:  #1. GERD. Symptoms are well controlled with double dose PPI. #2. Nausea and bloating has  improved. Suspect the symptoms may be related to plaquenil. #3. She is up-to-date on screening for CRC.Marland Kitchen   Plan:  Decrease pantoprazole to 40 mg by mouth q. A.m. Will request colonoscopy records from [facility]. Patient advised to keep ibuprofen used a minimum. She can take ranitidine 150 mg by mouth daily when necessary for breakthrough heartburn. Office visit in 6 months.

## 2014-11-20 ENCOUNTER — Ambulatory Visit (HOSPITAL_COMMUNITY)
Admission: RE | Admit: 2014-11-20 | Discharge: 2014-11-20 | Disposition: A | Discharge status: Home / Self Care | Payer: Medicare Other | Source: Ambulatory Visit | Attending: Orthopedic Surgery | Admitting: Orthopedic Surgery

## 2014-11-20 ENCOUNTER — Encounter: Payer: Self-pay | Admitting: Orthopedic Surgery

## 2014-11-20 ENCOUNTER — Ambulatory Visit (INDEPENDENT_AMBULATORY_CARE_PROVIDER_SITE_OTHER): Payer: Medicare Other | Admitting: Orthopedic Surgery

## 2014-11-20 VITALS — BP 127/67 | Ht 64.0 in | Wt 168.0 lb

## 2014-11-20 DIAGNOSIS — M1712 Unilateral primary osteoarthritis, left knee: Secondary | ICD-10-CM

## 2014-11-20 DIAGNOSIS — M25562 Pain in left knee: Secondary | ICD-10-CM | POA: Diagnosis present

## 2014-11-20 NOTE — Progress Notes (Signed)
Patient ID: Kayla Berg, female   DOB: [birth year], 68 y.o.   MRN: [id number] Chief Complaint  Patient presents with  . Follow-up    6 month recheck and xray left knee   Chief complaint joint pain left greater than right knee pain  This patient had previous knee arthroscopy was previously followed by [facility] then Dr. Percell Miller and Nicholaus Bloom. Presents for transfer of care and reevaluation left knee status post recent injection by her rheumatologist complains of only mild constant aching at this time. Pain is noted when she is working in the yard or climbing steps which he does not do sequentially.  I have all of her previous records I reviewed them she's basically had knee arthroscopy with meniscectomy and treatment for arthritis. Both knees have an arthroscopic surgery. Her past medical history is notable for depression and arthritis  Previous history as noted patient notes that she has some difficulty negotiating the stairs or standing for long periods of time. However, her pain is usually relieved with ibuprofen  No new findings under review of systems.  VS BP 127/67 mmHg  Ht 5\' 4"  (1.626 m)  Wt 168 lb (76.204 kg)  BMI 28.82 kg/m2 Knee looks good today. Range of motion looks good stability is normal McMurray sign negative neurovascular exam is intact Gen. appearance is normal The patient is alert and oriented person place and time Mood is normal affect is normal Ambulatory status normal  Stable arthritis continue current management follow-up as needed

## 2014-11-20 NOTE — Patient Instructions (Signed)
Continue current activities 

## 2014-12-14 ENCOUNTER — Encounter (INDEPENDENT_AMBULATORY_CARE_PROVIDER_SITE_OTHER): Payer: Self-pay

## 2015-01-31 ENCOUNTER — Ambulatory Visit (INDEPENDENT_AMBULATORY_CARE_PROVIDER_SITE_OTHER): Payer: Medicare Other | Admitting: Internal Medicine

## 2015-05-13 ENCOUNTER — Encounter: Payer: Self-pay | Admitting: Orthopedic Surgery

## 2015-05-13 ENCOUNTER — Telehealth: Payer: Self-pay | Admitting: Orthopedic Surgery

## 2015-05-13 ENCOUNTER — Ambulatory Visit (INDEPENDENT_AMBULATORY_CARE_PROVIDER_SITE_OTHER): Payer: PPO | Admitting: Orthopedic Surgery

## 2015-05-13 VITALS — BP 115/62 | Ht 64.0 in | Wt 164.0 lb

## 2015-05-13 DIAGNOSIS — S63641S Sprain of metacarpophalangeal joint of right thumb, sequela: Secondary | ICD-10-CM | POA: Diagnosis not present

## 2015-05-13 NOTE — Telephone Encounter (Signed)
Patient came to check-out window from visit for thumb problem; states she has a pain in right upper leg/thigh that occurs after driving for 1/2 hour and over; asking for recommendation, or schedule new appointment?  [phone]

## 2015-05-13 NOTE — Progress Notes (Signed)
This is a new problem  Chief Complaint  Patient presents with  . Hand Problem    right thumb pain began 05/06/15, has since resolved    History the patient fell and injured her right hand and thumb x-ray show [facility] arthritis. She wore brace. She says now the pain is almost resolved. She did complain of some ulnar collateral ligament sided right thumb pain.  She denies any weakness catching locking or numbness  Exam shows a well-developed well-nourished female vital signs are stable BP 115/62 mmHg  Ht 5\' 4"  (1.626 m)  Wt 164 lb (74.39 kg)  BMI 28.14 kg/m2 The grooming is normal. Her mood is pleasant her gait is normal. Inspection shows some prominence of the metacarpal head with increased valgus opening without pain on stress test versus her left thumb firm endpoint is felt. Pinch strength is normal. Skin normal. Sensation in the hand is normal. And good color capillary refill and vascularity is noted.  X-rays report from an outside facility and there was no evidence of fracture  Impression Encounter Diagnosis  Name Primary?  . Rupture of ulnar collateral ligament of thumb, right, sequela Yes    Plan since her pain is better she has good pinch strength I advised no need for reconstruction.

## 2015-08-23 ENCOUNTER — Other Ambulatory Visit (HOSPITAL_COMMUNITY): Payer: Self-pay | Admitting: Family Medicine

## 2015-08-23 DIAGNOSIS — Z1231 Encounter for screening mammogram for malignant neoplasm of breast: Secondary | ICD-10-CM

## 2015-09-04 ENCOUNTER — Ambulatory Visit (HOSPITAL_COMMUNITY)
Admission: RE | Admit: 2015-09-04 | Discharge: 2015-09-04 | Disposition: A | Discharge status: Home / Self Care | Payer: PPO | Source: Ambulatory Visit | Attending: Family Medicine | Admitting: Family Medicine

## 2015-09-04 DIAGNOSIS — Z1231 Encounter for screening mammogram for malignant neoplasm of breast: Secondary | ICD-10-CM | POA: Diagnosis present

## 2016-01-06 ENCOUNTER — Telehealth (INDEPENDENT_AMBULATORY_CARE_PROVIDER_SITE_OTHER): Payer: Self-pay | Admitting: *Deleted

## 2016-01-06 NOTE — Telephone Encounter (Signed)
Patient was called . She says that she has run out of her Pantoprazole Sodium 40 mg. She would like to know if Dr. Laural Golden would want her to continue this , if so , she will need to have a new prescription.  Patient was advised that this would be review with Dr.Rehman on 01/07/16 and we would call her back with his recommendation.

## 2016-01-06 NOTE — Telephone Encounter (Signed)
Please call patient, has question about medication Ph# [phone]

## 2016-01-08 ENCOUNTER — Other Ambulatory Visit (INDEPENDENT_AMBULATORY_CARE_PROVIDER_SITE_OTHER): Payer: Self-pay | Admitting: *Deleted

## 2016-01-08 MED ORDER — PANTOPRAZOLE SODIUM 40 MG PO TBEC: 40.0000 mg | DELAYED_RELEASE_TABLET | Freq: Every day | ORAL | Status: DC

## 2016-01-08 NOTE — Telephone Encounter (Signed)
Per Dr.Rehman - patient should stay on the Pantoprazole 40 mg - take 1 by mouth daily #30 5 refills. Rx sent to [facility] in [place] and patient was made aware.

## 2016-01-08 NOTE — Telephone Encounter (Signed)
Per Dr.Rehman may fill this prescription.

## 2016-01-09 DIAGNOSIS — Z23 Encounter for immunization: Secondary | ICD-10-CM | POA: Diagnosis not present

## 2016-01-09 DIAGNOSIS — F419 Anxiety disorder, unspecified: Secondary | ICD-10-CM | POA: Diagnosis not present

## 2016-01-09 DIAGNOSIS — Z0000 Encounter for general adult medical examination without abnormal findings: Secondary | ICD-10-CM | POA: Diagnosis not present

## 2016-01-09 DIAGNOSIS — M3500 Sicca syndrome, unspecified: Secondary | ICD-10-CM | POA: Diagnosis not present

## 2016-01-09 DIAGNOSIS — A6000 Herpesviral infection of urogenital system, unspecified: Secondary | ICD-10-CM | POA: Diagnosis not present

## 2016-01-09 DIAGNOSIS — K219 Gastro-esophageal reflux disease without esophagitis: Secondary | ICD-10-CM | POA: Diagnosis not present

## 2016-01-21 DIAGNOSIS — Z79899 Other long term (current) drug therapy: Secondary | ICD-10-CM | POA: Diagnosis not present

## 2016-01-21 DIAGNOSIS — M150 Primary generalized (osteo)arthritis: Secondary | ICD-10-CM | POA: Diagnosis not present

## 2016-01-21 DIAGNOSIS — M3500 Sicca syndrome, unspecified: Secondary | ICD-10-CM | POA: Diagnosis not present

## 2016-01-29 ENCOUNTER — Telehealth: Payer: Self-pay | Admitting: Internal Medicine

## 2016-01-29 NOTE — Telephone Encounter (Signed)
Received records from [facility] for appointment with Dr Debara Pickett on 02/12/16.  Records given to [facility] (medical records) for Dr Lysbeth Penner schedule on 02/12/16. lp

## 2016-02-12 ENCOUNTER — Ambulatory Visit (INDEPENDENT_AMBULATORY_CARE_PROVIDER_SITE_OTHER): Payer: PPO | Admitting: Internal Medicine

## 2016-02-12 ENCOUNTER — Encounter: Payer: Self-pay | Admitting: Internal Medicine

## 2016-02-12 VITALS — BP 104/66 | HR 67 | Ht 63.5 in | Wt 172.2 lb

## 2016-02-12 DIAGNOSIS — Z79899 Other long term (current) drug therapy: Secondary | ICD-10-CM | POA: Diagnosis not present

## 2016-02-12 DIAGNOSIS — R072 Precordial pain: Secondary | ICD-10-CM | POA: Diagnosis not present

## 2016-02-12 DIAGNOSIS — R079 Chest pain, unspecified: Secondary | ICD-10-CM | POA: Diagnosis not present

## 2016-02-12 DIAGNOSIS — M3500 Sicca syndrome, unspecified: Secondary | ICD-10-CM | POA: Insufficient documentation

## 2016-02-12 DIAGNOSIS — G8929 Other chronic pain: Secondary | ICD-10-CM | POA: Diagnosis not present

## 2016-02-12 DIAGNOSIS — R0609 Other forms of dyspnea: Secondary | ICD-10-CM

## 2016-02-12 DIAGNOSIS — R0600 Dyspnea, unspecified: Secondary | ICD-10-CM

## 2016-02-12 NOTE — Patient Instructions (Signed)
Cardiac CT Angiography (CTA), is a special type of CT scan that uses a computer to produce multi-dimensional views of major blood vessels throughout the heart. In CT angiography, a contrast material is injected through an IV to help visualize the blood vessels -- this is done at [facility]  -- you will need blood work done prior to this test to make sure your kidney function is OK since there is IV dye involved in the study  -- there is a lab on the 1st floor of this building, [address]  Your physician recommends that you schedule a follow-up appointment with Dr. Debara Pickett after your test.

## 2016-02-12 NOTE — Progress Notes (Signed)
OFFICE NOTE  Chief Complaint:  Chest pain, DOE  Primary Care Physician: Deloria Lair, MD  HPI:  Kayla Berg is a pleasant 69 year old female whose husband is a patient of mine. She presents today for evaluation of progressive chest discomfort and shortness of breath. The symptoms started about a year ago and she was referred for stress echocardiogram which was performed at [facility]. This is reportedly normal from her primary care provider. I have not been able to review the images or report. Since that time she's had some progressive shortness of breath and pressure in her chest. It feels like a squeezing it's worse with exertion and relieved by rest. She does have a history of reflux and occasionally feels that food gets stuck in her chest, but the symptoms are not necessarily after eating. She sees Dr. Amil Amen for Sjogren's disease and may also have some rheumatoid arthritis. She takes plaque 10. Otherwise she has no significant medical problems. Recently she was started on aspirin and low-dose beta blocker without any significant improvement in her symptoms. There some history of heart disease in the family.  PMHx:  Past Medical History  Diagnosis Date  . GERD (gastroesophageal reflux disease)   . Arthritis   . Sjoegren syndrome [facility])     Past Surgical History  Procedure Laterality Date  . Total abdominal hysterectomy      FAMHx:  Family History  Problem Relation Age of Onset  . Heart disease Father     "hole in his heart"   . Heart disease Mother   . Diabetes Mother   . Lung cancer Brother   . Asthma Brother   . Heart disease Sister   . Stroke Sister     SOCHx:   reports that she has never smoked. She has never used smokeless tobacco. She reports that she drinks alcohol. She reports that she does not use illicit drugs.  ALLERGIES:  Allergies  Allergen Reactions  . Codeine   . Sulfa Antibiotics     ROS: Pertinent items noted in HPI and remainder of  comprehensive ROS otherwise negative.  HOME MEDS: Current Outpatient Prescriptions  Medication Sig Dispense Refill  . calcium carbonate (OS-CAL) 600 MG TABS Take 600 mg by mouth 2 (two) times daily with a meal.    . chlordiazePOXIDE (LIBRIUM) 25 MG capsule Take 25 mg by mouth 3 (three) times daily as needed.    . cholecalciferol (VITAMIN D) 400 UNITS TABS Take by mouth 2 (two) times daily.     Marland Kitchen FLUoxetine (PROZAC) 20 MG capsule Take 20 mg by mouth daily.    . hydroxychloroquine (PLAQUENIL) 200 MG tablet Take by mouth 2 (two) times daily before a meal.    . ibuprofen (ADVIL,MOTRIN) 200 MG tablet Take 200 mg by mouth every 6 (six) hours as needed for pain.    . multivitamin-iron-minerals-folic acid (CENTRUM) chewable tablet Chew 1 tablet by mouth daily.    Marland Kitchen omeprazole (PRILOSEC) 40 MG capsule Take 40 mg by mouth daily.    . pantoprazole (PROTONIX) 40 MG tablet Take 1 tablet (40 mg total) by mouth daily. 30 tablet 5  . ranitidine (ZANTAC) 150 MG tablet Take 1 tablet (150 mg total) by mouth at bedtime as needed for heartburn.    . valACYclovir (VALTREX) 500 MG tablet 1 g daily.      No current facility-administered medications for this visit.    LABS/IMAGING: No results found for this or any previous visit (from the past 48  hour(s)). No results found.  WEIGHTS: Wt Readings from Last 3 Encounters:  02/12/16 172 lb 3.2 oz (78.109 kg)  05/13/15 164 lb (74.39 kg)  11/20/14 168 lb (76.204 kg)    VITALS: BP 104/66 mmHg  Pulse 67  Ht 5' 3.5" (1.613 m)  Wt 172 lb 3.2 oz (78.109 kg)  BMI 30.02 kg/m2  EXAM: General appearance: alert and no distress Neck: no carotid bruit, no JVD and thyroid not enlarged, symmetric, no tenderness/mass/nodules Lungs: clear to auscultation bilaterally Heart: regular rate and rhythm, S1, S2 normal, no murmur, click, rub or gallop Abdomen: soft, non-tender; bowel sounds normal; no masses,  no organomegaly Extremities: extremities normal, atraumatic, no  cyanosis or edema Pulses: 2+ and symmetric Skin: Skin color, texture, turgor normal. No rashes or lesions Neurologic: Grossly normal psych: Mildly anxious  EKG: Normal sinus rhythm at 67 with nonspecific T-wave changes  ASSESSMENT: 1. Persistent and progressive chest pain and shortness of breath 2. Negative stress echocardiogram in 2015 3. Sjogren's disease  PLAN: 1.   Ms. Keer is had some progressive and persistent chest pain and shortness of breath and had a fairly recent negative stress echocardiogram. She has few traditional cardiac risk factors, but age, family history and  Connective tissue disease may increase her risk of possible coronary artery disease. She says her symptoms sometimes are worse after eating but definitely associated with exertion and relieved by rest. I believe she needs more sensitive evaluation of her coronaries. She's not interested in cardiac catheterization and this may be a little too invasive. I think she is a good candidate for CT coronary angiogram given the fact that her recent stress test was negative and she continues to have symptoms. Plan to see her back after coronary CT angiography and if the results are negative then we may need to consider pulmonary function testing or a repeat GI evaluation for stricture or other esophageal processes which could cause chest tightness.  Thanks for the kind referral.  Pixie Casino, MD, [facility] Attending Cardiologist [place] 02/12/2016, 1:55 PM

## 2016-02-13 LAB — BASIC METABOLIC PANEL
BUN: 17 mg/dL (ref 7–25)
CALCIUM: 9.6 mg/dL (ref 8.6–10.4)
CO2: 29 mmol/L (ref 20–31)
CREATININE: 0.97 mg/dL — AB (ref 0.60–0.93)
Chloride: 102 mmol/L (ref 98–110)
GLUCOSE: 95 mg/dL (ref 65–99)
Potassium: 4.2 mmol/L (ref 3.5–5.3)
SODIUM: 136 mmol/L (ref 135–146)

## 2016-02-14 ENCOUNTER — Encounter: Payer: Self-pay | Admitting: Internal Medicine

## 2016-02-25 ENCOUNTER — Ambulatory Visit (HOSPITAL_COMMUNITY)
Admission: RE | Admit: 2016-02-25 | Discharge: 2016-02-25 | Disposition: A | Discharge status: Home / Self Care | Payer: PPO | Source: Ambulatory Visit | Attending: Internal Medicine | Admitting: Internal Medicine

## 2016-02-25 ENCOUNTER — Encounter (HOSPITAL_COMMUNITY): Payer: Self-pay

## 2016-02-25 DIAGNOSIS — G8929 Other chronic pain: Secondary | ICD-10-CM | POA: Diagnosis not present

## 2016-02-25 DIAGNOSIS — R079 Chest pain, unspecified: Secondary | ICD-10-CM

## 2016-02-25 MED ORDER — METOPROLOL TARTRATE 1 MG/ML IV SOLN
5.0000 mg | INTRAVENOUS | Status: DC | PRN
  Administered 2016-02-25 (×3): 5 mg via INTRAVENOUS
  Filled 2016-02-25 (×4): qty 5

## 2016-02-25 MED ORDER — NITROGLYCERIN 0.4 MG SL SUBL
0.8000 mg | SUBLINGUAL_TABLET | Freq: Once | SUBLINGUAL | Status: AC
Start: 2016-02-25 — End: 2016-02-25
  Administered 2016-02-25: 0.8 mg via SUBLINGUAL
  Filled 2016-02-25: qty 25

## 2016-02-25 MED ORDER — IOHEXOL 350 MG/ML SOLN
80.0000 mL | Freq: Once | INTRAVENOUS | Status: AC | PRN
  Administered 2016-02-25: 80 mL via INTRAVENOUS

## 2016-02-25 MED ORDER — METOPROLOL TARTRATE 1 MG/ML IV SOLN
INTRAVENOUS | Status: AC
  Filled 2016-02-25: qty 15

## 2016-02-25 MED ORDER — NITROGLYCERIN 0.4 MG SL SUBL
SUBLINGUAL_TABLET | SUBLINGUAL | Status: AC
  Filled 2016-02-25: qty 2

## 2016-02-25 NOTE — Progress Notes (Signed)
CT scan completed. Tolerated well. D/C home walking, awake and alert. In no distress. 

## 2016-04-06 ENCOUNTER — Telehealth: Payer: Self-pay | Admitting: Internal Medicine

## 2016-04-06 NOTE — Telephone Encounter (Signed)
Per AVS from last OV, patient was supposed to follow up after testing.

## 2016-04-06 NOTE — Telephone Encounter (Signed)
Happy to see her back to discuss findings - she may need referral for PFT's or pulmonary for her dyspnea, given normal coronaries.  Dr. Lemmie Evens

## 2016-04-06 NOTE — Telephone Encounter (Signed)
Pt had CT done 02-25-16, when does she need follow up-pls call [phone]

## 2016-04-06 NOTE — Telephone Encounter (Signed)
Coronary calcium score done end of Feb. Pt calling for recommendation for return visit. Will route to Dr. Debara Pickett.  Notes Recorded by Pixie Casino, MD on 02/28/2016 at 8:41 AM Zero coronary calcium - angiographically normal coronary arteries. Very low risk findings.  Dr. Debara Pickett

## 2016-04-07 NOTE — Telephone Encounter (Signed)
Patient called and scheduled for MD appt 05/01/16 @ 345pm

## 2016-05-01 ENCOUNTER — Encounter: Payer: Self-pay | Admitting: Internal Medicine

## 2016-05-01 ENCOUNTER — Ambulatory Visit (INDEPENDENT_AMBULATORY_CARE_PROVIDER_SITE_OTHER): Payer: PPO | Admitting: Internal Medicine

## 2016-05-01 VITALS — BP 127/77 | HR 79 | Ht 63.5 in | Wt 168.0 lb

## 2016-05-01 DIAGNOSIS — R072 Precordial pain: Secondary | ICD-10-CM

## 2016-05-01 DIAGNOSIS — M3500 Sicca syndrome, unspecified: Secondary | ICD-10-CM

## 2016-05-01 DIAGNOSIS — R0600 Dyspnea, unspecified: Secondary | ICD-10-CM

## 2016-05-01 DIAGNOSIS — R0609 Other forms of dyspnea: Secondary | ICD-10-CM

## 2016-05-01 NOTE — Patient Instructions (Signed)
Follow up as needed

## 2016-05-02 NOTE — Progress Notes (Signed)
OFFICE NOTE  Chief Complaint:  Follow-up CT coronary angiogram  Primary Care Physician: Deloria Lair, MD  HPI:  Kayla Berg is a pleasant 70 year old female whose husband is a patient of mine. She presents today for evaluation of progressive chest discomfort and shortness of breath. The symptoms started about a year ago and she was referred for stress echocardiogram which was performed at [facility]. This is reportedly normal from her primary care provider. I have not been able to review the images or report. Since that time she's had some progressive shortness of breath and pressure in her chest. It feels like a squeezing it's worse with exertion and relieved by rest. She does have a history of reflux and occasionally feels that food gets stuck in her chest, but the symptoms are not necessarily after eating. She sees Dr. Amil Amen for Sjogren's disease and may also have some rheumatoid arthritis. She takes plaque 10. Otherwise she has no significant medical problems. Recently she was started on aspirin and low-dose beta blocker without any significant improvement in her symptoms. There some history of heart disease in the family.  05/01/2016  Kayla Berg returns today for follow-up of her CT coronary angiogram. I'm pleased to report there was no significant coronary artery disease and a coronary calcium score of 0. No significant extracardiac findings were noted as well. She feels relieved that the results.  PMHx:  Past Medical History  Diagnosis Date  . GERD (gastroesophageal reflux disease)   . Arthritis   . Sjoegren syndrome [facility])     Past Surgical History  Procedure Laterality Date  . Total abdominal hysterectomy      FAMHx:  Family History  Problem Relation Age of Onset  . Heart disease Father     "hole in his heart"   . Heart disease Mother   . Diabetes Mother   . Lung cancer Brother   . Asthma Brother   . Heart disease Sister   . Stroke Sister     SOCHx:   reports that she has never smoked. She has never used smokeless tobacco. She reports that she drinks alcohol. She reports that she does not use illicit drugs.  ALLERGIES:  Allergies  Allergen Reactions  . Codeine   . Sulfa Antibiotics     ROS: Pertinent items noted in HPI and remainder of comprehensive ROS otherwise negative.  HOME MEDS: Current Outpatient Prescriptions  Medication Sig Dispense Refill  . calcium carbonate (OS-CAL) 600 MG TABS Take 600 mg by mouth 2 (two) times daily with a meal.    . chlordiazePOXIDE (LIBRIUM) 25 MG capsule Take 25 mg by mouth 3 (three) times daily as needed.    . cholecalciferol (VITAMIN D) 400 UNITS TABS Take by mouth 2 (two) times daily.     Marland Kitchen FLUoxetine (PROZAC) 20 MG capsule Take 20 mg by mouth daily.    . hydroxychloroquine (PLAQUENIL) 200 MG tablet Take by mouth 2 (two) times daily before a meal.    . ibuprofen (ADVIL,MOTRIN) 200 MG tablet Take 200 mg by mouth every 6 (six) hours as needed for pain.    . metoprolol succinate (TOPROL-XL) 25 MG 24 hr tablet Take 1 tablet by mouth as needed.    . multivitamin-iron-minerals-folic acid (CENTRUM) chewable tablet Chew 1 tablet by mouth daily.    Marland Kitchen omeprazole (PRILOSEC) 40 MG capsule Take 40 mg by mouth daily. Reported on 02/25/2016    . pantoprazole (PROTONIX) 40 MG tablet Take 1 tablet (40 mg total)  by mouth daily. 30 tablet 5  . ranitidine (ZANTAC) 150 MG tablet Take 1 tablet (150 mg total) by mouth at bedtime as needed for heartburn.    . valACYclovir (VALTREX) 500 MG tablet 1 g daily.      No current facility-administered medications for this visit.    LABS/IMAGING: No results found for this or any previous visit (from the past 48 hour(s)). No results found.  WEIGHTS: Wt Readings from Last 3 Encounters:  05/01/16 168 lb (76.204 kg)  02/12/16 172 lb 3.2 oz (78.109 kg)  05/13/15 164 lb (74.39 kg)    VITALS: BP 127/77 mmHg  Pulse 79  Ht 5' 3.5" (1.613 m)  Wt 168 lb (76.204 kg)  BMI  29.29 kg/m2  EXAM: Deferred  EKG: Deferred  ASSESSMENT: 1. Persistent and progressive chest pain and shortness of breath - normal coronary arteries with a 0 calcium score 2. Negative stress echocardiogram in 2015 3. Sjogren's disease  PLAN: 1.   Ms. Katayama has had some progressive and persistent chest pain and shortness of breath and had a fairly recent negative stress echocardiogram.  Her CT coronary angiogram which is almost as good as a traditional coronary angiogram demonstrates no obstructive coronary artery disease and a coronary artery calcium score of 0. This effectively rules out CAD as a cause of her symptoms. Her shortness of breath could be related to a pulmonary process or perhaps an autoimmune disorder related to Sjogren's disease. She is encouraged to pursue further workup through her primary care provider or rheumatologist.  Follow-up with me as needed.  Pixie Casino, MD, [facility] Attending Cardiologist [place] 05/02/2016, 11:51 AM

## 2016-05-15 ENCOUNTER — Other Ambulatory Visit (INDEPENDENT_AMBULATORY_CARE_PROVIDER_SITE_OTHER): Payer: Self-pay | Admitting: *Deleted

## 2016-05-15 MED ORDER — PANTOPRAZOLE SODIUM 40 MG PO TBEC: 40.0000 mg | DELAYED_RELEASE_TABLET | Freq: Every day | ORAL | Status: DC

## 2016-05-15 NOTE — Telephone Encounter (Signed)
Office rec'd a fax request for refill on the Pantoprazole.

## 2016-06-15 ENCOUNTER — Ambulatory Visit (INDEPENDENT_AMBULATORY_CARE_PROVIDER_SITE_OTHER): Payer: PPO | Admitting: Orthopedic Surgery

## 2016-06-15 ENCOUNTER — Ambulatory Visit (INDEPENDENT_AMBULATORY_CARE_PROVIDER_SITE_OTHER): Payer: PPO

## 2016-06-15 VITALS — BP 121/75 | HR 79 | Ht 63.5 in | Wt 166.0 lb

## 2016-06-15 DIAGNOSIS — M1712 Unilateral primary osteoarthritis, left knee: Secondary | ICD-10-CM

## 2016-06-15 DIAGNOSIS — M25562 Pain in left knee: Secondary | ICD-10-CM

## 2016-06-15 NOTE — Patient Instructions (Signed)
You have received an injection of steroids into the joint. 15% of patients will have increased pain within the 24 hours postinjection.   This is transient and will go away.   We recommend that you use ice packs on the injection site for 20 minutes every 2 hours and extra strength Tylenol 2 tablets every 8 as needed until the pain resolves.  If you continue to have pain after taking the Tylenol and using the ice please call the office for further instructions.  Go to PT for HEP

## 2016-06-17 NOTE — Progress Notes (Signed)
Chief Complaint  Patient presents with  . Knee Pain    Left knee pain   HPI 70 year old female presents with atraumatic onset of left knee pain Quality dull ache Timeframe 3 weeks Location medial side left knee Timing constant Modifying factors appear to be weightbearing   Review of Systems  Constitutional: Negative for fever and chills.  Musculoskeletal: Negative for myalgias.  Neurological: Negative for tingling.    Past Medical History  Diagnosis Date  . GERD (gastroesophageal reflux disease)   . Arthritis   . Sjoegren syndrome [facility])     Past Surgical History  Procedure Laterality Date  . Total abdominal hysterectomy     Family History  Problem Relation Age of Onset  . Heart disease Father     "hole in his heart"   . Heart disease Mother   . Diabetes Mother   . Lung cancer Brother   . Asthma Brother   . Heart disease Sister   . Stroke Sister    Social History  Substance Use Topics  . Smoking status: Never Smoker   . Smokeless tobacco: Never Used  . Alcohol Use: Yes     Comment: moderately - wine    Current outpatient prescriptions:  .  calcium carbonate (OS-CAL) 600 MG TABS, Take 600 mg by mouth 2 (two) times daily with a meal., Disp: , Rfl:  .  chlordiazePOXIDE (LIBRIUM) 25 MG capsule, Take 25 mg by mouth 3 (three) times daily as needed., Disp: , Rfl:  .  cholecalciferol (VITAMIN D) 400 UNITS TABS, Take by mouth 2 (two) times daily. , Disp: , Rfl:  .  FLUoxetine (PROZAC) 20 MG capsule, Take 20 mg by mouth daily., Disp: , Rfl:  .  hydroxychloroquine (PLAQUENIL) 200 MG tablet, Take by mouth 2 (two) times daily before a meal., Disp: , Rfl:  .  ibuprofen (ADVIL,MOTRIN) 200 MG tablet, Take 200 mg by mouth every 6 (six) hours as needed for pain., Disp: , Rfl:  .  metoprolol succinate (TOPROL-XL) 25 MG 24 hr tablet, Take 1 tablet by mouth as needed., Disp: , Rfl:  .  multivitamin-iron-minerals-folic acid (CENTRUM) chewable tablet, Chew 1 tablet by mouth daily.,  Disp: , Rfl:  .  omeprazole (PRILOSEC) 40 MG capsule, Take 40 mg by mouth daily. Reported on 02/25/2016, Disp: , Rfl:  .  pantoprazole (PROTONIX) 40 MG tablet, Take 1 tablet (40 mg total) by mouth daily., Disp: 30 tablet, Rfl: 5 .  ranitidine (ZANTAC) 150 MG tablet, Take 1 tablet (150 mg total) by mouth at bedtime as needed for heartburn., Disp: , Rfl:  .  valACYclovir (VALTREX) 500 MG tablet, 1 g daily. , Disp: , Rfl:   BP 121/75 mmHg  Pulse 79  Ht 5' 3.5" (1.613 m)  Wt 166 lb (75.297 kg)  BMI 28.94 kg/m2  Physical Exam  Constitutional: She is oriented to person, place, and time. She appears well-developed and well-nourished. No distress.  Cardiovascular: Normal rate and intact distal pulses.   Musculoskeletal:       Left knee: She exhibits effusion.  Gait is remarkable for antalgic gait favoring left knee  Neurological: She is alert and oriented to person, place, and time. She has normal reflexes. She exhibits normal muscle tone. Coordination normal.  Skin: Skin is warm and dry. No rash noted. She is not diaphoretic. No erythema. No pallor.  Psychiatric: She has a normal mood and affect. Her behavior is normal. Judgment and thought content normal.    Right Knee Exam  Tenderness  The patient is experiencing no tenderness.     Range of Motion  Extension: normal  Flexion: normal   Muscle Strength   The patient has normal right knee strength.  Tests  McMurray:  Medial - negative Lateral - negative Drawer:       Anterior - negative    Posterior - negative Varus: negative Valgus: negative  Other  Erythema: absent Scars: absent Sensation: normal Pulse: present Swelling: none   Left Knee Exam   Tenderness  The patient is experiencing tenderness in the medial joint line.  Range of Motion  Extension: normal  Flexion: normal Left knee flexion: 125.   Muscle Strength   The patient has normal left knee strength.  Tests  McMurray:  Medial - positive Lateral -  negative Drawer:       Anterior - negative     Posterior - negative Varus: negative Valgus: negative  Other  Erythema: absent Scars: absent Sensation: normal Pulse: present Swelling: none Effusion: effusion present     ASSESSMENT: I ordered an x-ray of the left knee My personal interpretation of the images:  Left knee x-rays 3 views  Left knee pain  Mild arthritis seen throughout the knee symmetric joint space narrowing medial lateral compartment minimal secondary bone changes impression mild to moderate arthritis left knee  PLAN Plan is for injection left knee joint and physical therapy    Arther Abbott, MD 06/17/2016 7:26 AM

## 2016-07-03 DIAGNOSIS — R0600 Dyspnea, unspecified: Secondary | ICD-10-CM | POA: Diagnosis not present

## 2016-07-03 DIAGNOSIS — R0609 Other forms of dyspnea: Secondary | ICD-10-CM | POA: Diagnosis not present

## 2016-07-12 DIAGNOSIS — W57XXXA Bitten or stung by nonvenomous insect and other nonvenomous arthropods, initial encounter: Secondary | ICD-10-CM | POA: Diagnosis not present

## 2016-07-12 DIAGNOSIS — Z79899 Other long term (current) drug therapy: Secondary | ICD-10-CM | POA: Diagnosis not present

## 2016-07-12 DIAGNOSIS — Z87891 Personal history of nicotine dependence: Secondary | ICD-10-CM | POA: Diagnosis not present

## 2016-07-12 DIAGNOSIS — S0086XA Insect bite (nonvenomous) of other part of head, initial encounter: Secondary | ICD-10-CM | POA: Diagnosis not present

## 2016-07-20 DIAGNOSIS — M5136 Other intervertebral disc degeneration, lumbar region: Secondary | ICD-10-CM | POA: Diagnosis not present

## 2016-07-20 DIAGNOSIS — Z79899 Other long term (current) drug therapy: Secondary | ICD-10-CM | POA: Diagnosis not present

## 2016-07-20 DIAGNOSIS — M150 Primary generalized (osteo)arthritis: Secondary | ICD-10-CM | POA: Diagnosis not present

## 2016-07-20 DIAGNOSIS — M2550 Pain in unspecified joint: Secondary | ICD-10-CM | POA: Diagnosis not present

## 2016-07-20 DIAGNOSIS — M3500 Sicca syndrome, unspecified: Secondary | ICD-10-CM | POA: Diagnosis not present

## 2016-08-07 ENCOUNTER — Encounter: Payer: Self-pay | Admitting: Internal Medicine

## 2016-08-07 ENCOUNTER — Other Ambulatory Visit: Payer: PPO

## 2016-08-07 ENCOUNTER — Ambulatory Visit (INDEPENDENT_AMBULATORY_CARE_PROVIDER_SITE_OTHER): Payer: PPO | Admitting: Internal Medicine

## 2016-08-07 ENCOUNTER — Ambulatory Visit (INDEPENDENT_AMBULATORY_CARE_PROVIDER_SITE_OTHER)
Admission: RE | Admit: 2016-08-07 | Discharge: 2016-08-07 | Disposition: A | Discharge status: Home / Self Care | Payer: PPO | Source: Ambulatory Visit | Attending: Internal Medicine | Admitting: Internal Medicine

## 2016-08-07 VITALS — BP 114/70 | HR 85 | Ht 63.5 in | Wt 167.0 lb

## 2016-08-07 DIAGNOSIS — M3500 Sicca syndrome, unspecified: Secondary | ICD-10-CM

## 2016-08-07 DIAGNOSIS — R05 Cough: Secondary | ICD-10-CM

## 2016-08-07 DIAGNOSIS — R0609 Other forms of dyspnea: Secondary | ICD-10-CM | POA: Diagnosis not present

## 2016-08-07 DIAGNOSIS — R0600 Dyspnea, unspecified: Secondary | ICD-10-CM

## 2016-08-07 DIAGNOSIS — R058 Other specified cough: Secondary | ICD-10-CM

## 2016-08-07 NOTE — Patient Instructions (Addendum)
Continue protonix (pantoprazole) 40 mg Take 30-60 min before first meal of the day and zantac 150 mg at bedtime automatically until return   GERD (REFLUX)  is an extremely common cause of respiratory symptoms just like yours , many times with no obvious heartburn at all.    It can be treated with medication, but also with lifestyle changes including elevation of the head of your bed (ideally with 6 inch  bed blocks),  Smoking cessation, avoidance of late meals, excessive alcohol, and avoid fatty foods, chocolate, peppermint, colas, red wine, and acidic juices such as orange juice.  NO MINT OR MENTHOL PRODUCTS SO NO COUGH DROPS   USE SUGARLESS CANDY INSTEAD (Jolley ranchers or Stover's or Astronomer) or even ice chips will also do - the key is to swallow to prevent throat clearing as much as possible NO OIL BASED VITAMINS - use powdered substitutes.   Please schedule a follow up office visit in 6 weeks, call sooner if needed with pfts at [facility] same day

## 2016-08-07 NOTE — Progress Notes (Signed)
Subjective:    Patient ID: Kayla Berg, female    DOB: [birth year],    MRN: [id number]  HPI  60 yobf never smoker sjorgrens dx  around 2007/8 dry mouth /gen arthritis and Rx  plaquenil since onset under Dr Melissa Noon care with new onset sob 2015 referred to pulmonary clinic 08/07/2016 by Dr Scotty Court   08/07/2016 1st [place] [facility] office visit/ Shalene Gallen   Chief Complaint  Patient presents with  . Pulmoanry Consult    Pt referred by Dr. Scotty Court for SOB x 2 years. Pt states the SOB occurs when she is active and resolves when resting. Pt states she was recently put on Metoprolol and it has helped. Pt c/o nightly prod cough with yellow mucus. Pt denies CP/tightness and f/c/s.   indolent onset doe x yardwork/ steps assoc with hoarseness / cough worse at hs   MMRC2 = can't walk a nl pace on a flat grade s sob but does fine slow and flat eg shopping at [facility] but not [employer]   Already on rx for gerd no better / feels arthritis well controlled on plaquenil   No obvious day to day or daytime variability or assoc   mucus plugs or hemoptysis or cp or chest tightness, subjective wheeze or overt sinus or hb symptoms. No unusual exp hx or h/o childhood pna/ asthma or knowledge of premature birth.  Sleeping ok without nocturnal  or early am exacerbation  of respiratory  c/o's or need for noct saba. Also denies any obvious fluctuation of symptoms with weather or environmental changes or other aggravating or alleviating factors except as outlined above   Current Medications, Allergies, Complete Past Medical History, Past Surgical History, Family History, and Social History were reviewed in [employer] record.          Review of Systems  Constitutional: Negative for fever and unexpected weight change.  HENT: Positive for congestion. Negative for dental problem, ear pain, nosebleeds, postnasal drip, rhinorrhea, sinus pressure, sneezing, sore throat and trouble swallowing.   Eyes: Negative for  redness and itching.  Respiratory: Positive for cough and shortness of breath. Negative for chest tightness and wheezing.   Cardiovascular: Negative for palpitations and leg swelling.  Gastrointestinal: Negative for nausea and vomiting.  Genitourinary: Negative for dysuria.  Musculoskeletal: Negative for joint swelling.  Skin: Negative for rash.  Neurological: Negative for headaches.  Hematological: Does not bruise/bleed easily.  Psychiatric/Behavioral: Negative for dysphoric mood. The patient is not nervous/anxious.        Objective:   Physical Exam  Pleasant amb wf nad  Wt Readings from Last 3 Encounters:  08/07/16 167 lb (75.8 kg)  06/15/16 166 lb (75.3 kg)  05/01/16 168 lb (76.2 kg)    Vital signs reviewed   HEENT: nl dentition, turbinates, and oropharynx. Nl external ear canals without cough reflex   NECK :  without JVD/Nodes/TM/ nl carotid upstrokes bilaterally   LUNGS: no acc muscle use,  Nl contour chest which is clear to A and P bilaterally without cough on insp or exp maneuvers   CV:  RRR  no s3 or murmur or increase in P2, no edema   ABD:  soft and nontender with nl inspiratory excursion in the supine position. No bruits or organomegaly, bowel sounds nl  MS:  Nl gait/ ext warm without deformities, calf tenderness, cyanosis or clubbing No obvious joint restrictions   SKIN: warm and dry without lesions    NEURO:  alert, approp, nl sensorium with  no motor deficits    CXR PA and Lateral:   08/07/2016 :    I personally reviewed images and agree with radiology impression as follows:    Cardiac silhouette is normal in size and configuration. Normal mediastinal and hilar contours.  Minor linear scarring or atelectasis in the left upper lobe lingula. Lungs are otherwise clear. No pleural effusion. No pneumothorax.  Skeletal structures are unremarkable.       Assessment & Plan:

## 2016-08-08 ENCOUNTER — Encounter: Payer: Self-pay | Admitting: Internal Medicine

## 2016-08-08 DIAGNOSIS — R058 Other specified cough: Secondary | ICD-10-CM | POA: Insufficient documentation

## 2016-08-08 DIAGNOSIS — R05 Cough: Secondary | ICD-10-CM | POA: Insufficient documentation

## 2016-08-08 NOTE — Assessment & Plan Note (Addendum)
No evidence of a flare in symptoms since onset of resp complaints. The general rule of thumb is interstitial lung disease associated with connective tissue disease typically only flares when the connective tissue disease is not well controlled which does not appear to be the case here, nor is it likely that the medications being used for connective tissue disease likely have any pulmonary side effects.    No changes in therapy recommended other than to obtain a set of baseline PFTs at this point.

## 2016-08-08 NOTE — Assessment & Plan Note (Signed)
Note cough is not reproducible with insp typical of an ILD so may well be this is all upper airway and rx for GERD max 24 h suppression diet is just the first step in the w/u. See avs

## 2016-08-08 NOTE — Assessment & Plan Note (Signed)
Ex Echo nl Jan 2016 - 08/07/2016  Walked RA x 3 laps @ 185 ft each stopped due to  End of study, slow pace, no sob or desat  With CXR no evidence ILD    Symptoms are   disproportionate to objective findings and not clear this is a lung problem but pt does appear to have difficult airway management issues. DDX of  difficult airways management almost all start with A and  include Adherence, Ace Inhibitors, Acid Reflux, Active Sinus Disease, Alpha 1 Antitripsin deficiency, Anxiety masquerading as Airways dz,  ABPA,  Allergy(esp in young), Aspiration (esp in elderly), Adverse effects of meds,  Active smokers, A bunch of PE's (a small clot burden can't cause this syndrome unless there is already severe underlying pulm or vascular dz with poor reserve) plus two Bs  = Bronchiectasis and Beta blocker use..and one C= CHF   Adherence is always the initial "prime suspect" and is a multilayered concern that requires a "trust but verify" approach in every patient - starting with knowing how to use medications, especially inhalers, correctly, keeping up with refills and understanding the fundamental difference between maintenance and prns vs those medications only taken for a very short course and then stopped and not refilled.   ? Acid (or non-acid) GERD > always difficult to exclude as up to 75% of pts in some series report no assoc GI/ Heartburn symptoms> rec max (24h)  acid suppression and diet restrictions/ reviewed and instructions given in writing.   ? Active sinus Dz > consider sinus CT next  ? Allergy/ asthma > doubt   ? BB effect > actually better since started metaprolol and doubt asthmatic so ok to continue   rec proceed with full pfts p 4 weeks max rx for GERD then regroup same day as pfts  Total time devoted to counseling  = 35/61m review case with pt/ discussion of options/alternatives/ personally creating written instructions  in presence of pt  then going over those specific  Instructions  directly with the pt including how to use all of the meds but in particular covering each new medication in detail and the difference between the maintenance/automatic meds and the prns using an action plan format for the latter.

## 2016-08-25 ENCOUNTER — Other Ambulatory Visit (HOSPITAL_COMMUNITY): Payer: Self-pay | Admitting: Family Medicine

## 2016-08-25 DIAGNOSIS — Z1231 Encounter for screening mammogram for malignant neoplasm of breast: Secondary | ICD-10-CM

## 2016-09-09 ENCOUNTER — Ambulatory Visit (HOSPITAL_COMMUNITY)
Admission: RE | Admit: 2016-09-09 | Discharge: 2016-09-09 | Disposition: A | Discharge status: Home / Self Care | Payer: PPO | Source: Ambulatory Visit | Attending: Family Medicine | Admitting: Family Medicine

## 2016-09-09 DIAGNOSIS — Z1231 Encounter for screening mammogram for malignant neoplasm of breast: Secondary | ICD-10-CM | POA: Insufficient documentation

## 2016-09-21 ENCOUNTER — Encounter: Payer: Self-pay | Admitting: Internal Medicine

## 2016-09-21 ENCOUNTER — Ambulatory Visit (HOSPITAL_COMMUNITY)
Admission: RE | Admit: 2016-09-21 | Discharge: 2016-09-21 | Disposition: A | Discharge status: Home / Self Care | Payer: PPO | Source: Ambulatory Visit | Attending: Internal Medicine | Admitting: Internal Medicine

## 2016-09-21 ENCOUNTER — Ambulatory Visit (INDEPENDENT_AMBULATORY_CARE_PROVIDER_SITE_OTHER): Payer: PPO | Admitting: Internal Medicine

## 2016-09-21 ENCOUNTER — Other Ambulatory Visit: Payer: Self-pay | Admitting: Internal Medicine

## 2016-09-21 VITALS — BP 114/70 | HR 74 | Ht 63.5 in | Wt 169.8 lb

## 2016-09-21 DIAGNOSIS — R0600 Dyspnea, unspecified: Secondary | ICD-10-CM

## 2016-09-21 DIAGNOSIS — J988 Other specified respiratory disorders: Secondary | ICD-10-CM | POA: Diagnosis not present

## 2016-09-21 DIAGNOSIS — Z23 Encounter for immunization: Secondary | ICD-10-CM

## 2016-09-21 DIAGNOSIS — R0609 Other forms of dyspnea: Secondary | ICD-10-CM | POA: Diagnosis not present

## 2016-09-21 LAB — PULMONARY FUNCTION TEST
DL/VA % pred: 93 %
DL/VA: 4.36 ml/min/mmHg/L
DLCO UNC % PRED: 75 %
DLCO UNC: 17.3 ml/min/mmHg
FEF 25-75 PRE: 3.02 L/s
FEF 25-75 Post: 2.64 L/sec
FEF2575-%CHANGE-POST: -12 %
FEF2575-%Pred-Post: 172 %
FEF2575-%Pred-Pre: 197 %
FEV1-%CHANGE-POST: -2 %
FEV1-%PRED-POST: 123 %
FEV1-%PRED-PRE: 126 %
FEV1-POST: 2.08 L
FEV1-Pre: 2.13 L
FEV1FVC-%Change-Post: 1 %
FEV1FVC-%Pred-Pre: 113 %
FEV6-%Change-Post: -4 %
FEV6-%PRED-POST: 111 %
FEV6-%Pred-Pre: 116 %
FEV6-POST: 2.33 L
FEV6-PRE: 2.43 L
FEV6FVC-%PRED-PRE: 104 %
FEV6FVC-%Pred-Post: 104 %
FVC-%Change-Post: -4 %
FVC-%PRED-POST: 107 %
FVC-%Pred-Pre: 111 %
FVC-Post: 2.33 L
FVC-Pre: 2.43 L
POST FEV1/FVC RATIO: 89 %
PRE FEV6/FVC RATIO: 100 %
Post FEV6/FVC ratio: 100 %
Pre FEV1/FVC ratio: 88 %
RV % PRED: 76 %
RV: 1.68 L
TLC % PRED: 84 %
TLC: 4.13 L

## 2016-09-21 MED ORDER — RANITIDINE HCL 150 MG PO TABS: 150.0000 mg | ORAL_TABLET | Freq: Every day | ORAL | 11 refills | Status: DC

## 2016-09-21 MED ORDER — ALBUTEROL SULFATE (2.5 MG/3ML) 0.083% IN NEBU
2.5000 mg | INHALATION_SOLUTION | Freq: Once | RESPIRATORY_TRACT | Status: AC
  Administered 2016-09-21: 2.5 mg via RESPIRATORY_TRACT

## 2016-09-21 NOTE — Progress Notes (Signed)
Subjective:    Patient ID: Kayla Berg, female    DOB: 1946/05/28,    MRN: [id number]    Brief patient profile:  60 yobf never smoker sjorgrens dx  around 2007/8 dry mouth /gen arthritis and Rx  plaquenil since onset under Dr Melissa Noon care with new onset sob 2015 referred to pulmonary clinic 08/07/2016 by Dr Scotty Court p neg cardiac w/u.    History of Present Illness  08/07/2016 [facility] office visit/ Kayla Berg   Chief Complaint  Patient presents with  . Pulmoanry Consult    Pt referred by Dr. Scotty Court for SOB x 2 years. Pt states the SOB occurs when she is active and resolves when resting. Pt states she was recently put on Metoprolol and it has helped. Pt c/o nightly prod cough with yellow mucus. Pt denies CP/tightness and f/c/s.   indolent onset doe x yardwork/ steps assoc with hoarseness / cough worse at hs  MMRC2 = can't walk a nl pace on a flat grade s sob but does fine slow and flat eg shopping at [facility] but not [employer]  Already on rx for gerd no better / feels arthritis not well controlled on plaquenil  rec Continue protonix (pantoprazole) 40 mg Take 30-60 min before first meal of the day and zantac 150 mg at bedtime automatically until return  GERD diet   09/21/2016  f/u ov/Kayla Berg re:  Unexplained sob/ on ppi qam and h2hs  Chief Complaint  Patient presents with  . Follow-up    Breathing is unchanged. No new co's.   noisy breathing sev times a week doesn't wake her up, present for several years better on zantac hs  Doe = mmrc2 x sev years  In general no sob at rest    No obvious day to day or daytime variability or assoc chronic cough or cp or chest tightness, subjective wheeze or overt sinus or hb symptoms. No unusual exp hx or h/o childhood pna/ asthma or knowledge of premature birth.  Sleeping ok without nocturnal  or early am exacerbation  of respiratory  c/o's or need for noct saba. Also denies any obvious fluctuation of symptoms with weather or environmental changes or other  aggravating or alleviating factors except as outlined above   Current Medications, Allergies, Complete Past Medical History, Past Surgical History, Family History, and Social History were reviewed in [employer] record.  ROS  The following are not active complaints unless bolded sore throat, dysphagia, dental problems, itching, sneezing,  nasal congestion or excess/ purulent secretions, ear ache,   fever, chills, sweats, unintended wt loss, classically pleuritic or exertional cp, hemoptysis,  orthopnea pnd or leg swelling, presyncope, palpitations, abdominal pain, anorexia, nausea, vomiting, diarrhea  or change in bowel or bladder habits, change in stools or urine, dysuria,hematuria,  rash, arthralgias, visual complaints, headache, numbness, weakness or ataxia or problems with walking or coordination,  change in mood/affect or memory.                        Objective:   Physical Exam  Pleasant amb wf nad  09/21/2016        170  08/07/16 167 lb (75.8 kg)  06/15/16 166 lb (75.3 kg)  05/01/16 168 lb (76.2 kg)    Vital signs reviewed   HEENT: nl dentition, turbinates, and oropharynx. Nl external ear canals without cough reflex   NECK :  without JVD/Nodes/TM/ nl carotid upstrokes bilaterally   LUNGS: no acc  muscle use,  Nl contour chest which is clear to A and P bilaterally without cough on insp or exp maneuvers   CV:  RRR  no s3 or murmur or increase in P2, no edema   ABD:  soft and nontender with nl inspiratory excursion in the supine position. No bruits or organomegaly, bowel sounds nl  MS:  Nl gait/ ext warm without deformities, calf tenderness, cyanosis or clubbing No obvious joint restrictions   SKIN: warm and dry without lesions    NEURO:  alert, approp, nl sensorium with  no motor deficits    CXR PA and Lateral:   08/07/2016 :    I personally reviewed images and agree with radiology impression as follows:    Cardiac silhouette is normal in  size and configuration. Normal mediastinal and hilar contours.  Minor linear scarring or atelectasis in the left upper lobe lingula. Lungs are otherwise clear. No pleural effusion. No pneumothorax.          Assessment & Plan:

## 2016-09-21 NOTE — Assessment & Plan Note (Signed)
Ex Echo nl Jan 2016 - 08/07/2016  Walked RA x 3 laps @ 185 ft each stopped due to  End of study, slow pace, no sob or desat  With CXR no evidence ILD   - PFT's  09/21/2016   wnl including curvature     I had an extended final summary discussion with the patient reviewing all relevant studies completed to date and  lasting 15 to 20 minutes of a 25 minute visit on the following issues:    1) no evidence of a pulmonary mechanism limiting exertion  2) may have benefited from adding zantac to ppi so needs gi f/u prn per PC  3) No need for pulmonary f/u at this point  4)  Each maintenance medication was reviewed in detail including most importantly the difference between maintenance and as needed and under what circumstances the prns are to be used.  Please see instructions for details which were reviewed in writing and the patient given a copy.    5) pulmonary f/u is prn

## 2016-09-21 NOTE — Patient Instructions (Addendum)
Weight control is simply a matter of calorie balance which needs to be tilted in your favor by eating less and exercising more.  To get the most out of exercise, you need to be continuously aware that you are short of breath, but never out of breath, for 30 minutes daily. As you improve, it will actually be easier for you to do the same amount of exercise  in  30 minutes so always push to the level where you are short of breath.  If this does not result in gradual weight reduction then I strongly recommend you see a nutritionist with a food diary x 2 weeks so that we can work out a negative calorie balance which is universally effective in steady weight loss programs.  Think of your calorie balance like you do your bank account where in this case you want the balance to go down so you must take in less calories than you burn up.  It's just that simple:  Hard to do, but easy to understand.  Good luck!     If you are satisfied with your treatment plan,  let your doctor know and he/she can either refill your medications or you can return here when your prescription runs out.     If in any way you are not 100% satisfied,  please tell us.  If 100% better, tell your friends!  Pulmonary follow up is as needed   

## 2016-10-21 DIAGNOSIS — H25013 Cortical age-related cataract, bilateral: Secondary | ICD-10-CM | POA: Diagnosis not present

## 2016-10-21 DIAGNOSIS — H2513 Age-related nuclear cataract, bilateral: Secondary | ICD-10-CM | POA: Diagnosis not present

## 2016-10-21 DIAGNOSIS — H524 Presbyopia: Secondary | ICD-10-CM | POA: Diagnosis not present

## 2016-10-21 DIAGNOSIS — M3500 Sicca syndrome, unspecified: Secondary | ICD-10-CM | POA: Diagnosis not present

## 2016-10-21 DIAGNOSIS — H40013 Open angle with borderline findings, low risk, bilateral: Secondary | ICD-10-CM | POA: Diagnosis not present

## 2016-10-21 DIAGNOSIS — Z79899 Other long term (current) drug therapy: Secondary | ICD-10-CM | POA: Diagnosis not present

## 2016-10-26 ENCOUNTER — Other Ambulatory Visit (INDEPENDENT_AMBULATORY_CARE_PROVIDER_SITE_OTHER): Payer: Self-pay | Admitting: Internal Medicine

## 2016-10-27 NOTE — Telephone Encounter (Signed)
Patient will need to have an appointment prior to further refills. She has not been seen in [place] since 2015. Or patient may get this RX from her PCP. Please cal patient to explain.

## 2016-10-29 ENCOUNTER — Encounter (INDEPENDENT_AMBULATORY_CARE_PROVIDER_SITE_OTHER): Payer: Self-pay

## 2016-10-29 ENCOUNTER — Encounter (INDEPENDENT_AMBULATORY_CARE_PROVIDER_SITE_OTHER): Payer: Self-pay | Admitting: Internal Medicine

## 2016-10-29 NOTE — Telephone Encounter (Signed)
Patient was given an appointment with Deberah Castle, NP for 11/18/16 at 11:30am.  A letter was mailed to the patient.

## 2016-11-16 DIAGNOSIS — D239 Other benign neoplasm of skin, unspecified: Secondary | ICD-10-CM | POA: Diagnosis not present

## 2016-11-18 ENCOUNTER — Ambulatory Visit (INDEPENDENT_AMBULATORY_CARE_PROVIDER_SITE_OTHER): Payer: PPO | Admitting: Internal Medicine

## 2016-11-18 ENCOUNTER — Encounter (INDEPENDENT_AMBULATORY_CARE_PROVIDER_SITE_OTHER): Payer: Self-pay | Admitting: Internal Medicine

## 2016-11-18 VITALS — BP 128/70 | HR 72 | Temp 98.2°F | Ht 63.0 in | Wt 167.3 lb

## 2016-11-18 DIAGNOSIS — K219 Gastro-esophageal reflux disease without esophagitis: Secondary | ICD-10-CM

## 2016-11-18 MED ORDER — PANTOPRAZOLE SODIUM 40 MG PO TBEC: 40.0000 mg | DELAYED_RELEASE_TABLET | Freq: Every day | ORAL | 4 refills | Status: DC

## 2016-11-18 NOTE — Patient Instructions (Signed)
Continue the Protonix. OV in 1 year.  

## 2016-11-18 NOTE — Progress Notes (Signed)
   Subjective:    Patient ID: Kayla Berg, female    DOB: [birth year], 70 y.o.   MRN: [id number]  HPI Here today for f/u. She was last seen in August of 2015 for GERD. Has been maintained on Protonix. She tells me she is doing good. Appetite is good. No weight loss. Acid reflux controlled with Protonix. She says she feels good. She does say her limbs hurt. She has a BM daily. No melena or BRRB.     Review of Systems Past Medical History:  Diagnosis Date  . Arthritis   . GERD (gastroesophageal reflux disease)   . Sjoegren syndrome [facility])     Past Surgical History:  Procedure Laterality Date  . TOTAL ABDOMINAL HYSTERECTOMY      Allergies  Allergen Reactions  . Codeine   . Sulfa Antibiotics     Current Outpatient Prescriptions on File Prior to Visit  Medication Sig Dispense Refill  . calcium carbonate (OS-CAL) 600 MG TABS Take 600 mg by mouth daily with breakfast.     . chlordiazePOXIDE (LIBRIUM) 25 MG capsule Take 25 mg by mouth 3 (three) times daily as needed.    . cholecalciferol (VITAMIN D) 400 UNITS TABS Take by mouth daily.     Marland Kitchen FLUoxetine (PROZAC) 20 MG capsule Take 20 mg by mouth daily.    . hydroxychloroquine (PLAQUENIL) 200 MG tablet Take by mouth 2 (two) times daily before a meal.    . ibuprofen (ADVIL,MOTRIN) 200 MG tablet Take 200 mg by mouth every 6 (six) hours as needed for pain.    . metoprolol succinate (TOPROL-XL) 25 MG 24 hr tablet Take 1 tablet by mouth as needed.    . multivitamin-iron-minerals-folic acid (CENTRUM) chewable tablet Chew 1 tablet by mouth daily.    . pantoprazole (PROTONIX) 40 MG tablet TAKE ONE TABLET BY MOUTH ONCE DAILY 120 tablet 1  . ranitidine (ZANTAC) 150 MG tablet Take 1 tablet (150 mg total) by mouth at bedtime. 30 tablet 11  . valACYclovir (VALTREX) 500 MG tablet 1 g daily.      No current facility-administered medications on file prior to visit.        Objective:   Physical Exam Blood pressure 128/70, pulse 72, temperature 98.2  F (36.8 C), height 5\' 3"  (1.6 m), weight 167 lb 4.8 oz (75.9 kg). Alert and oriented. Skin warm and dry. Oral mucosa is moist.   . Sclera anicteric, conjunctivae is pink. Thyroid not enlarged. No cervical lymphadenopathy. Lungs clear. Heart regular rate and rhythm.  Abdomen is soft. Bowel sounds are positive. No hepatomegaly. No abdominal masses felt. No tenderness.  No edema to lower extremities.          Assessment & Plan:  GERD: continue Protonix daily. Refill on her Protonix.   OV in 1 year.

## 2017-01-18 DIAGNOSIS — Z0000 Encounter for general adult medical examination without abnormal findings: Secondary | ICD-10-CM | POA: Diagnosis not present

## 2017-01-18 DIAGNOSIS — M3500 Sicca syndrome, unspecified: Secondary | ICD-10-CM | POA: Diagnosis not present

## 2017-01-18 DIAGNOSIS — K219 Gastro-esophageal reflux disease without esophagitis: Secondary | ICD-10-CM | POA: Diagnosis not present

## 2017-01-18 DIAGNOSIS — Z23 Encounter for immunization: Secondary | ICD-10-CM | POA: Diagnosis not present

## 2017-01-18 DIAGNOSIS — F419 Anxiety disorder, unspecified: Secondary | ICD-10-CM | POA: Diagnosis not present

## 2017-01-18 DIAGNOSIS — Z6828 Body mass index (BMI) 28.0-28.9, adult: Secondary | ICD-10-CM | POA: Diagnosis not present

## 2017-02-01 DIAGNOSIS — M150 Primary generalized (osteo)arthritis: Secondary | ICD-10-CM | POA: Diagnosis not present

## 2017-02-01 DIAGNOSIS — M2550 Pain in unspecified joint: Secondary | ICD-10-CM | POA: Diagnosis not present

## 2017-02-01 DIAGNOSIS — E663 Overweight: Secondary | ICD-10-CM | POA: Diagnosis not present

## 2017-02-01 DIAGNOSIS — Z79899 Other long term (current) drug therapy: Secondary | ICD-10-CM | POA: Diagnosis not present

## 2017-02-01 DIAGNOSIS — Z6829 Body mass index (BMI) 29.0-29.9, adult: Secondary | ICD-10-CM | POA: Diagnosis not present

## 2017-02-01 DIAGNOSIS — M3500 Sicca syndrome, unspecified: Secondary | ICD-10-CM | POA: Diagnosis not present

## 2017-02-01 DIAGNOSIS — M5136 Other intervertebral disc degeneration, lumbar region: Secondary | ICD-10-CM | POA: Diagnosis not present

## 2017-02-02 DIAGNOSIS — R531 Weakness: Secondary | ICD-10-CM | POA: Diagnosis not present

## 2017-02-02 DIAGNOSIS — R0602 Shortness of breath: Secondary | ICD-10-CM | POA: Diagnosis not present

## 2017-02-02 DIAGNOSIS — I209 Angina pectoris, unspecified: Secondary | ICD-10-CM | POA: Diagnosis not present

## 2017-02-02 DIAGNOSIS — R079 Chest pain, unspecified: Secondary | ICD-10-CM | POA: Diagnosis not present

## 2017-02-02 DIAGNOSIS — Z79899 Other long term (current) drug therapy: Secondary | ICD-10-CM | POA: Diagnosis not present

## 2017-02-02 DIAGNOSIS — M79602 Pain in left arm: Secondary | ICD-10-CM | POA: Diagnosis not present

## 2017-02-02 DIAGNOSIS — K219 Gastro-esophageal reflux disease without esophagitis: Secondary | ICD-10-CM | POA: Diagnosis not present

## 2017-04-26 DIAGNOSIS — Z79899 Other long term (current) drug therapy: Secondary | ICD-10-CM | POA: Diagnosis not present

## 2017-04-26 DIAGNOSIS — H04123 Dry eye syndrome of bilateral lacrimal glands: Secondary | ICD-10-CM | POA: Diagnosis not present

## 2017-04-26 DIAGNOSIS — M3500 Sicca syndrome, unspecified: Secondary | ICD-10-CM | POA: Diagnosis not present

## 2017-05-27 ENCOUNTER — Other Ambulatory Visit (INDEPENDENT_AMBULATORY_CARE_PROVIDER_SITE_OTHER): Payer: Self-pay | Admitting: *Deleted

## 2017-05-27 DIAGNOSIS — K219 Gastro-esophageal reflux disease without esophagitis: Secondary | ICD-10-CM

## 2017-05-27 MED ORDER — PANTOPRAZOLE SODIUM 40 MG PO TBEC: 40.0000 mg | DELAYED_RELEASE_TABLET | Freq: Every day | ORAL | 3 refills | Status: DC

## 2017-08-03 DIAGNOSIS — M3500 Sicca syndrome, unspecified: Secondary | ICD-10-CM | POA: Diagnosis not present

## 2017-08-03 DIAGNOSIS — M2550 Pain in unspecified joint: Secondary | ICD-10-CM | POA: Diagnosis not present

## 2017-08-03 DIAGNOSIS — M5136 Other intervertebral disc degeneration, lumbar region: Secondary | ICD-10-CM | POA: Diagnosis not present

## 2017-08-03 DIAGNOSIS — Z6828 Body mass index (BMI) 28.0-28.9, adult: Secondary | ICD-10-CM | POA: Diagnosis not present

## 2017-08-03 DIAGNOSIS — M150 Primary generalized (osteo)arthritis: Secondary | ICD-10-CM | POA: Diagnosis not present

## 2017-08-03 DIAGNOSIS — Z79899 Other long term (current) drug therapy: Secondary | ICD-10-CM | POA: Diagnosis not present

## 2017-08-03 DIAGNOSIS — E663 Overweight: Secondary | ICD-10-CM | POA: Diagnosis not present

## 2017-08-23 ENCOUNTER — Other Ambulatory Visit (HOSPITAL_COMMUNITY): Payer: Self-pay | Admitting: Family Medicine

## 2017-08-23 DIAGNOSIS — Z1231 Encounter for screening mammogram for malignant neoplasm of breast: Secondary | ICD-10-CM

## 2017-09-15 ENCOUNTER — Ambulatory Visit (HOSPITAL_COMMUNITY)
Admission: RE | Admit: 2017-09-15 | Discharge: 2017-09-15 | Disposition: A | Discharge status: Home / Self Care | Payer: PPO | Source: Ambulatory Visit | Attending: Family Medicine | Admitting: Family Medicine

## 2017-09-15 DIAGNOSIS — Z1231 Encounter for screening mammogram for malignant neoplasm of breast: Secondary | ICD-10-CM | POA: Insufficient documentation

## 2017-09-24 ENCOUNTER — Encounter (INDEPENDENT_AMBULATORY_CARE_PROVIDER_SITE_OTHER): Payer: Self-pay

## 2017-09-24 ENCOUNTER — Encounter (INDEPENDENT_AMBULATORY_CARE_PROVIDER_SITE_OTHER): Payer: Self-pay | Admitting: Internal Medicine

## 2017-10-25 DIAGNOSIS — H40013 Open angle with borderline findings, low risk, bilateral: Secondary | ICD-10-CM | POA: Diagnosis not present

## 2017-10-25 DIAGNOSIS — H2513 Age-related nuclear cataract, bilateral: Secondary | ICD-10-CM | POA: Diagnosis not present

## 2017-10-25 DIAGNOSIS — H25013 Cortical age-related cataract, bilateral: Secondary | ICD-10-CM | POA: Diagnosis not present

## 2017-10-25 DIAGNOSIS — H04123 Dry eye syndrome of bilateral lacrimal glands: Secondary | ICD-10-CM | POA: Diagnosis not present

## 2017-11-04 DIAGNOSIS — I10 Essential (primary) hypertension: Secondary | ICD-10-CM | POA: Diagnosis not present

## 2017-11-04 DIAGNOSIS — R1032 Left lower quadrant pain: Secondary | ICD-10-CM | POA: Diagnosis not present

## 2017-11-04 DIAGNOSIS — K5901 Slow transit constipation: Secondary | ICD-10-CM | POA: Diagnosis not present

## 2017-11-04 DIAGNOSIS — Z23 Encounter for immunization: Secondary | ICD-10-CM | POA: Diagnosis not present

## 2017-11-17 ENCOUNTER — Encounter (INDEPENDENT_AMBULATORY_CARE_PROVIDER_SITE_OTHER): Payer: Self-pay | Admitting: Internal Medicine

## 2017-11-17 ENCOUNTER — Ambulatory Visit (INDEPENDENT_AMBULATORY_CARE_PROVIDER_SITE_OTHER): Payer: PPO | Admitting: Internal Medicine

## 2017-11-17 VITALS — BP 120/60 | HR 68 | Temp 98.0°F | Ht 63.0 in | Wt 161.6 lb

## 2017-11-17 DIAGNOSIS — K219 Gastro-esophageal reflux disease without esophagitis: Secondary | ICD-10-CM

## 2017-11-17 NOTE — Progress Notes (Signed)
   Subjective:    Patient ID: Kayla Berg, female    DOB: 1946-09-14, 71 y.o.   MRN: [id number]  HPI Here today for f/u. Last seen in November 2017 for chronic GERD. She tells me she is not doing good. Her husband is wearing her out. Her husband is taking chemo.  Her acid reflux is good as long as she doesn't eat greasy foods. Her appetite is good. She has lost about 5 pounds.     Review of Systems  Past Medical History:  Diagnosis Date  . Arthritis   . GERD (gastroesophageal reflux disease)   . Sjoegren syndrome [facility])     Past Surgical History:  Procedure Laterality Date  . TOTAL ABDOMINAL HYSTERECTOMY      Allergies  Allergen Reactions  . Codeine   . Sulfa Antibiotics     Current Outpatient Medications on File Prior to Visit  Medication Sig Dispense Refill  . calcium carbonate (OS-CAL) 600 MG TABS Take 600 mg by mouth daily with breakfast.     . chlordiazePOXIDE (LIBRIUM) 25 MG capsule Take 25 mg by mouth 3 (three) times daily as needed.    . cholecalciferol (VITAMIN D) 400 UNITS TABS Take by mouth daily.     Marland Kitchen FLUoxetine (PROZAC) 20 MG capsule Take 20 mg by mouth daily.    . hydroxychloroquine (PLAQUENIL) 200 MG tablet Take by mouth 2 (two) times daily before a meal.    . ibuprofen (ADVIL,MOTRIN) 200 MG tablet Take 200 mg by mouth every 6 (six) hours as needed for pain.    . metoprolol succinate (TOPROL-XL) 25 MG 24 hr tablet Take 1 tablet by mouth as needed.    . multivitamin-iron-minerals-folic acid (CENTRUM) chewable tablet Chew 1 tablet by mouth daily.    . pantoprazole (PROTONIX) 40 MG tablet Take 1 tablet (40 mg total) by mouth daily. 90 tablet 3  . ranitidine (ZANTAC) 150 MG tablet Take 1 tablet (150 mg total) by mouth at bedtime. 30 tablet 11  . valACYclovir (VALTREX) 500 MG tablet 1 g daily.      No current facility-administered medications on file prior to visit.         Objective:   Physical Exam Blood pressure 120/60, pulse 68, temperature 98 F (36.7  C), height 5\' 3"  (1.6 m), weight 161 lb 9.6 oz (73.3 kg).Alert and oriented. Skin warm and dry. Oral mucosa is moist.   . Sclera anicteric, conjunctivae is pink. Thyroid not enlarged. No cervical lymphadenopathy. Lungs clear. Heart regular rate and rhythm.  Abdomen is soft. Bowel sounds are positive. No hepatomegaly. No abdominal masses felt. No tenderness.  No edema to lower extremities.              Assessment & Plan:  GERD. She is doing well. Continue the Protonix. OV in 1 year.

## 2017-11-17 NOTE — Patient Instructions (Signed)
OV in 1 year. Continue the Protonix 

## 2018-02-10 ENCOUNTER — Other Ambulatory Visit: Payer: Self-pay | Admitting: Internal Medicine

## 2018-02-16 DIAGNOSIS — N898 Other specified noninflammatory disorders of vagina: Secondary | ICD-10-CM | POA: Diagnosis not present

## 2018-02-16 DIAGNOSIS — K219 Gastro-esophageal reflux disease without esophagitis: Secondary | ICD-10-CM | POA: Diagnosis not present

## 2018-02-16 DIAGNOSIS — R8290 Unspecified abnormal findings in urine: Secondary | ICD-10-CM | POA: Diagnosis not present

## 2018-02-16 DIAGNOSIS — K5901 Slow transit constipation: Secondary | ICD-10-CM | POA: Diagnosis not present

## 2018-02-16 DIAGNOSIS — R42 Dizziness and giddiness: Secondary | ICD-10-CM | POA: Diagnosis not present

## 2018-04-06 ENCOUNTER — Other Ambulatory Visit (INDEPENDENT_AMBULATORY_CARE_PROVIDER_SITE_OTHER): Payer: Self-pay | Admitting: Internal Medicine

## 2018-04-06 DIAGNOSIS — K219 Gastro-esophageal reflux disease without esophagitis: Secondary | ICD-10-CM

## 2018-04-19 DIAGNOSIS — H04123 Dry eye syndrome of bilateral lacrimal glands: Secondary | ICD-10-CM | POA: Diagnosis not present

## 2018-04-19 DIAGNOSIS — Z79899 Other long term (current) drug therapy: Secondary | ICD-10-CM | POA: Diagnosis not present

## 2018-04-19 DIAGNOSIS — M3500 Sicca syndrome, unspecified: Secondary | ICD-10-CM | POA: Diagnosis not present

## 2018-07-19 DIAGNOSIS — M2550 Pain in unspecified joint: Secondary | ICD-10-CM | POA: Diagnosis not present

## 2018-07-19 DIAGNOSIS — Z6828 Body mass index (BMI) 28.0-28.9, adult: Secondary | ICD-10-CM | POA: Diagnosis not present

## 2018-07-19 DIAGNOSIS — M3500 Sicca syndrome, unspecified: Secondary | ICD-10-CM | POA: Diagnosis not present

## 2018-07-19 DIAGNOSIS — E663 Overweight: Secondary | ICD-10-CM | POA: Diagnosis not present

## 2018-07-19 DIAGNOSIS — M150 Primary generalized (osteo)arthritis: Secondary | ICD-10-CM | POA: Diagnosis not present

## 2018-07-19 DIAGNOSIS — Z79899 Other long term (current) drug therapy: Secondary | ICD-10-CM | POA: Diagnosis not present

## 2018-07-19 DIAGNOSIS — M5136 Other intervertebral disc degeneration, lumbar region: Secondary | ICD-10-CM | POA: Diagnosis not present

## 2018-08-17 DIAGNOSIS — F419 Anxiety disorder, unspecified: Secondary | ICD-10-CM | POA: Diagnosis not present

## 2018-08-17 DIAGNOSIS — N952 Postmenopausal atrophic vaginitis: Secondary | ICD-10-CM | POA: Diagnosis not present

## 2018-08-17 DIAGNOSIS — F329 Major depressive disorder, single episode, unspecified: Secondary | ICD-10-CM | POA: Diagnosis not present

## 2018-08-17 DIAGNOSIS — Z780 Asymptomatic menopausal state: Secondary | ICD-10-CM | POA: Diagnosis not present

## 2018-08-17 DIAGNOSIS — Z0000 Encounter for general adult medical examination without abnormal findings: Secondary | ICD-10-CM | POA: Diagnosis not present

## 2018-08-17 DIAGNOSIS — K5901 Slow transit constipation: Secondary | ICD-10-CM | POA: Diagnosis not present

## 2018-08-17 DIAGNOSIS — Z23 Encounter for immunization: Secondary | ICD-10-CM | POA: Diagnosis not present

## 2018-08-17 DIAGNOSIS — Z1159 Encounter for screening for other viral diseases: Secondary | ICD-10-CM | POA: Diagnosis not present

## 2018-08-17 DIAGNOSIS — Z13220 Encounter for screening for lipoid disorders: Secondary | ICD-10-CM | POA: Diagnosis not present

## 2018-08-17 DIAGNOSIS — B009 Herpesviral infection, unspecified: Secondary | ICD-10-CM | POA: Diagnosis not present

## 2018-08-17 DIAGNOSIS — M3500 Sicca syndrome, unspecified: Secondary | ICD-10-CM | POA: Diagnosis not present

## 2018-08-17 DIAGNOSIS — I10 Essential (primary) hypertension: Secondary | ICD-10-CM | POA: Diagnosis not present

## 2018-09-20 DIAGNOSIS — Z780 Asymptomatic menopausal state: Secondary | ICD-10-CM | POA: Diagnosis not present

## 2018-09-20 DIAGNOSIS — M810 Age-related osteoporosis without current pathological fracture: Secondary | ICD-10-CM | POA: Diagnosis not present

## 2018-11-03 DIAGNOSIS — H04123 Dry eye syndrome of bilateral lacrimal glands: Secondary | ICD-10-CM | POA: Diagnosis not present

## 2018-11-03 DIAGNOSIS — Z79899 Other long term (current) drug therapy: Secondary | ICD-10-CM | POA: Diagnosis not present

## 2018-11-03 DIAGNOSIS — M3500 Sicca syndrome, unspecified: Secondary | ICD-10-CM | POA: Diagnosis not present

## 2018-11-03 DIAGNOSIS — H40013 Open angle with borderline findings, low risk, bilateral: Secondary | ICD-10-CM | POA: Diagnosis not present

## 2018-11-17 ENCOUNTER — Encounter (INDEPENDENT_AMBULATORY_CARE_PROVIDER_SITE_OTHER): Payer: Self-pay | Admitting: Internal Medicine

## 2018-11-17 ENCOUNTER — Ambulatory Visit (INDEPENDENT_AMBULATORY_CARE_PROVIDER_SITE_OTHER): Payer: PPO | Admitting: Internal Medicine

## 2018-11-17 VITALS — BP 150/82 | HR 60 | Temp 98.4°F | Ht 63.0 in | Wt 161.9 lb

## 2018-11-17 DIAGNOSIS — K219 Gastro-esophageal reflux disease without esophagitis: Secondary | ICD-10-CM

## 2018-11-17 NOTE — Progress Notes (Signed)
   Subjective:    Patient ID: Kayla Berg, female    DOB: December 14, 1946, 72 y.o.   MRN: [id number]  HPI Here today for f/u. Last seen in November of 2018. Hx of chronic GERD. Her weight has remained the same. Appetite is for the most part good. GERD controlled with Protonix and Pepcid at night. She has a BM daily. No melena or BRRB. She is not exercising.  Sometimes she has depression. She lost her husband in February.    Review of Systems Past Medical History:  Diagnosis Date  . Arthritis   . GERD (gastroesophageal reflux disease)   . Sjoegren syndrome     Past Surgical History:  Procedure Laterality Date  . TOTAL ABDOMINAL HYSTERECTOMY      Allergies  Allergen Reactions  . Codeine   . Sulfa Antibiotics     Current Outpatient Medications on File Prior to Visit  Medication Sig Dispense Refill  . alendronate (FOSAMAX) 70 MG tablet Take 70 mg by mouth once a week. Take with a full glass of water on an empty stomach.    . calcium carbonate (OS-CAL) 600 MG TABS Take 600 mg by mouth daily with breakfast.     . chlordiazePOXIDE (LIBRIUM) 25 MG capsule Take 25 mg by mouth 3 (three) times daily as needed.    . cholecalciferol (VITAMIN D) 400 UNITS TABS Take by mouth daily.     . famotidine (PEPCID) 20 MG tablet Take 20 mg by mouth at bedtime.    Marland Kitchen FLUoxetine (PROZAC) 20 MG capsule Take 20 mg by mouth daily.    . hydroxychloroquine (PLAQUENIL) 200 MG tablet Take by mouth 2 (two) times daily before a meal.    . ibuprofen (ADVIL,MOTRIN) 200 MG tablet Take 200 mg by mouth every 6 (six) hours as needed for pain.    . metoprolol succinate (TOPROL-XL) 25 MG 24 hr tablet Take 1 tablet by mouth as needed.    . multivitamin-iron-minerals-folic acid (CENTRUM) chewable tablet Chew 1 tablet by mouth daily.    . pantoprazole (PROTONIX) 40 MG tablet TAKE 1 TABLET BY MOUTH ONCE DAILY 90 tablet 3  . valACYclovir (VALTREX) 500 MG tablet 1 g daily.      No current facility-administered medications on  file prior to visit.         Objective:   Physical Exam Blood pressure (!) 150/82, pulse 60, temperature 98.4 F (36.9 C), height 5\' 3"  (1.6 m), weight 161 lb 14.4 oz (73.4 kg). Alert and oriented. Skin warm and dry. Oral mucosa is moist.   . Sclera anicteric, conjunctivae is pink. Thyroid not enlarged. No cervical lymphadenopathy. Lungs clear. Heart regular rate and rhythm.  Abdomen is soft. Bowel sounds are positive. No hepatomegaly. No abdominal masses felt. No tenderness.  No edema to lower extremities.           Assessment & Plan:  GERD. Continue the Protonix. She is doing well.  OV in 1 year.

## 2018-11-17 NOTE — Patient Instructions (Signed)
OV in 1 year.  

## 2018-11-18 DIAGNOSIS — L299 Pruritus, unspecified: Secondary | ICD-10-CM | POA: Diagnosis not present

## 2018-11-18 DIAGNOSIS — R5383 Other fatigue: Secondary | ICD-10-CM | POA: Diagnosis not present

## 2018-11-18 DIAGNOSIS — F329 Major depressive disorder, single episode, unspecified: Secondary | ICD-10-CM | POA: Diagnosis not present

## 2018-11-18 DIAGNOSIS — R531 Weakness: Secondary | ICD-10-CM | POA: Diagnosis not present

## 2018-11-18 DIAGNOSIS — K5901 Slow transit constipation: Secondary | ICD-10-CM | POA: Diagnosis not present

## 2018-11-18 DIAGNOSIS — R0602 Shortness of breath: Secondary | ICD-10-CM | POA: Diagnosis not present

## 2018-11-18 DIAGNOSIS — F419 Anxiety disorder, unspecified: Secondary | ICD-10-CM | POA: Diagnosis not present

## 2018-11-18 DIAGNOSIS — Z23 Encounter for immunization: Secondary | ICD-10-CM | POA: Diagnosis not present

## 2018-11-18 DIAGNOSIS — R0789 Other chest pain: Secondary | ICD-10-CM | POA: Diagnosis not present

## 2018-11-18 DIAGNOSIS — I10 Essential (primary) hypertension: Secondary | ICD-10-CM | POA: Diagnosis not present

## 2018-12-08 DIAGNOSIS — L2089 Other atopic dermatitis: Secondary | ICD-10-CM | POA: Diagnosis not present

## 2018-12-08 DIAGNOSIS — S0300XA Dislocation of jaw, unspecified side, initial encounter: Secondary | ICD-10-CM | POA: Diagnosis not present

## 2018-12-08 DIAGNOSIS — R0982 Postnasal drip: Secondary | ICD-10-CM | POA: Diagnosis not present

## 2018-12-26 DIAGNOSIS — Z1231 Encounter for screening mammogram for malignant neoplasm of breast: Secondary | ICD-10-CM | POA: Diagnosis not present

## 2019-01-17 DIAGNOSIS — Z0000 Encounter for general adult medical examination without abnormal findings: Secondary | ICD-10-CM | POA: Diagnosis not present

## 2019-01-17 DIAGNOSIS — Z23 Encounter for immunization: Secondary | ICD-10-CM | POA: Diagnosis not present

## 2019-01-17 DIAGNOSIS — I10 Essential (primary) hypertension: Secondary | ICD-10-CM | POA: Diagnosis not present

## 2019-01-17 DIAGNOSIS — E782 Mixed hyperlipidemia: Secondary | ICD-10-CM | POA: Diagnosis not present

## 2019-01-30 DIAGNOSIS — M150 Primary generalized (osteo)arthritis: Secondary | ICD-10-CM | POA: Diagnosis not present

## 2019-01-30 DIAGNOSIS — Z6828 Body mass index (BMI) 28.0-28.9, adult: Secondary | ICD-10-CM | POA: Diagnosis not present

## 2019-01-30 DIAGNOSIS — Z79899 Other long term (current) drug therapy: Secondary | ICD-10-CM | POA: Diagnosis not present

## 2019-01-30 DIAGNOSIS — E663 Overweight: Secondary | ICD-10-CM | POA: Diagnosis not present

## 2019-01-30 DIAGNOSIS — M2550 Pain in unspecified joint: Secondary | ICD-10-CM | POA: Diagnosis not present

## 2019-01-30 DIAGNOSIS — M3500 Sicca syndrome, unspecified: Secondary | ICD-10-CM | POA: Diagnosis not present

## 2019-01-30 DIAGNOSIS — M5136 Other intervertebral disc degeneration, lumbar region: Secondary | ICD-10-CM | POA: Diagnosis not present

## 2019-05-11 ENCOUNTER — Other Ambulatory Visit (INDEPENDENT_AMBULATORY_CARE_PROVIDER_SITE_OTHER): Payer: Self-pay | Admitting: Internal Medicine

## 2019-05-11 DIAGNOSIS — K219 Gastro-esophageal reflux disease without esophagitis: Secondary | ICD-10-CM

## 2019-07-18 DIAGNOSIS — R4189 Other symptoms and signs involving cognitive functions and awareness: Secondary | ICD-10-CM | POA: Diagnosis not present

## 2019-07-18 DIAGNOSIS — E782 Mixed hyperlipidemia: Secondary | ICD-10-CM | POA: Diagnosis not present

## 2019-07-18 DIAGNOSIS — R5383 Other fatigue: Secondary | ICD-10-CM | POA: Diagnosis not present

## 2019-07-18 DIAGNOSIS — Z113 Encounter for screening for infections with a predominantly sexual mode of transmission: Secondary | ICD-10-CM | POA: Diagnosis not present

## 2019-07-18 DIAGNOSIS — L679 Hair color and hair shaft abnormality, unspecified: Secondary | ICD-10-CM | POA: Diagnosis not present

## 2019-07-18 DIAGNOSIS — Z136 Encounter for screening for cardiovascular disorders: Secondary | ICD-10-CM | POA: Diagnosis not present

## 2019-07-18 DIAGNOSIS — Z13220 Encounter for screening for lipoid disorders: Secondary | ICD-10-CM | POA: Diagnosis not present

## 2019-08-07 DIAGNOSIS — Z79899 Other long term (current) drug therapy: Secondary | ICD-10-CM | POA: Diagnosis not present

## 2019-08-07 DIAGNOSIS — H40013 Open angle with borderline findings, low risk, bilateral: Secondary | ICD-10-CM | POA: Diagnosis not present

## 2019-08-07 DIAGNOSIS — M3500 Sicca syndrome, unspecified: Secondary | ICD-10-CM | POA: Diagnosis not present

## 2019-08-08 DIAGNOSIS — Z79899 Other long term (current) drug therapy: Secondary | ICD-10-CM | POA: Diagnosis not present

## 2019-08-08 DIAGNOSIS — D474 Osteomyelofibrosis: Secondary | ICD-10-CM | POA: Diagnosis not present

## 2019-08-11 DIAGNOSIS — Z6829 Body mass index (BMI) 29.0-29.9, adult: Secondary | ICD-10-CM | POA: Diagnosis not present

## 2019-08-11 DIAGNOSIS — E663 Overweight: Secondary | ICD-10-CM | POA: Diagnosis not present

## 2019-08-11 DIAGNOSIS — M5136 Other intervertebral disc degeneration, lumbar region: Secondary | ICD-10-CM | POA: Diagnosis not present

## 2019-08-11 DIAGNOSIS — M3500 Sicca syndrome, unspecified: Secondary | ICD-10-CM | POA: Diagnosis not present

## 2019-08-11 DIAGNOSIS — M150 Primary generalized (osteo)arthritis: Secondary | ICD-10-CM | POA: Diagnosis not present

## 2019-08-11 DIAGNOSIS — M2550 Pain in unspecified joint: Secondary | ICD-10-CM | POA: Diagnosis not present

## 2019-08-11 DIAGNOSIS — Z79899 Other long term (current) drug therapy: Secondary | ICD-10-CM | POA: Diagnosis not present

## 2019-09-26 DIAGNOSIS — D485 Neoplasm of uncertain behavior of skin: Secondary | ICD-10-CM | POA: Diagnosis not present

## 2019-09-26 DIAGNOSIS — L659 Nonscarring hair loss, unspecified: Secondary | ICD-10-CM | POA: Diagnosis not present

## 2019-10-08 ENCOUNTER — Ambulatory Visit: Payer: PPO | Attending: Neurology | Admitting: Neurology

## 2019-10-08 ENCOUNTER — Other Ambulatory Visit: Payer: Self-pay

## 2019-10-08 DIAGNOSIS — G4733 Obstructive sleep apnea (adult) (pediatric): Secondary | ICD-10-CM | POA: Diagnosis not present

## 2019-10-09 DIAGNOSIS — L659 Nonscarring hair loss, unspecified: Secondary | ICD-10-CM | POA: Diagnosis not present

## 2019-10-09 DIAGNOSIS — G4733 Obstructive sleep apnea (adult) (pediatric): Secondary | ICD-10-CM | POA: Diagnosis not present

## 2019-10-10 ENCOUNTER — Encounter (INDEPENDENT_AMBULATORY_CARE_PROVIDER_SITE_OTHER): Payer: Self-pay | Admitting: *Deleted

## 2019-10-17 NOTE — Procedures (Signed)
[place] A. Merlene Laughter, MD     [email or web address]             NOCTURNAL POLYSOMNOGRAPHY   LOCATION: [facility]   Patient Name: Kayla Berg, Kayla Berg Date: 10/08/2019 Gender: Female D.O.B: Jun 25, 1946 Age (years): 6 Referring Provider: Phillips Odor MD, ABSM Height (inches): 63 Interpreting Physician: Phillips Odor MD, ABSM Weight (lbs): 161 RPSGT: Rosebud Poles BMI: 29 MRN: [id number] Neck Size: 14.50 CLINICAL INFORMATION Sleep Study Type: NPSG     Indication for sleep study: N/A     Epworth Sleepiness Score: 12     SLEEP STUDY TECHNIQUE As per the AASM Manual for the Scoring of Sleep and Associated Events v2.3 (April 2016) with a hypopnea requiring 4% desaturations.  The channels recorded and monitored were frontal, central and occipital EEG, electrooculogram (EOG), submentalis EMG (chin), nasal and oral airflow, thoracic and abdominal wall motion, anterior tibialis EMG, snore microphone, electrocardiogram, and pulse oximetry.  MEDICATIONS Medications self-administered by patient taken the night of the study : N/A  Current Outpatient Medications:    alendronate (FOSAMAX) 70 MG tablet, Take 70 mg by mouth once a week. Take with a full glass of water on an empty stomach., Disp: , Rfl:    calcium carbonate (OS-CAL) 600 MG TABS, Take 600 mg by mouth daily with breakfast. , Disp: , Rfl:    chlordiazePOXIDE (LIBRIUM) 25 MG capsule, Take 25 mg by mouth 3 (three) times daily as needed., Disp: , Rfl:    cholecalciferol (VITAMIN D) 400 UNITS TABS, Take by mouth daily. , Disp: , Rfl:    famotidine (PEPCID) 20 MG tablet, Take 20 mg by mouth at bedtime., Disp: , Rfl:    FLUoxetine (PROZAC) 20 MG capsule, Take 20 mg by mouth daily., Disp: , Rfl:    hydroxychloroquine (PLAQUENIL) 200 MG tablet, Take by mouth 2 (two) times daily before a meal., Disp: , Rfl:    ibuprofen (ADVIL,MOTRIN) 200 MG tablet, Take 200 mg by mouth every 6 (six) hours as needed  for pain., Disp: , Rfl:    metoprolol succinate (TOPROL-XL) 25 MG 24 hr tablet, Take 1 tablet by mouth as needed., Disp: , Rfl:    multivitamin-iron-minerals-folic acid (CENTRUM) chewable tablet, Chew 1 tablet by mouth daily., Disp: , Rfl:    pantoprazole (PROTONIX) 40 MG tablet, Take 1 tablet by mouth once daily, Disp: 90 tablet, Rfl: 3   valACYclovir (VALTREX) 500 MG tablet, 1 g daily. , Disp: , Rfl:      SLEEP ARCHITECTURE The study was initiated at 12:13:11 AM and ended at 6:40:58 AM.  Sleep onset time was 45.2 minutes and the sleep efficiency was 81.0%%. The total sleep time was 314.1 minutes.  Stage REM latency was 271.5 minutes.  The patient spent 7.0%% of the night in stage N1 sleep, 81.5%% in stage N2 sleep, 1.9%% in stage N3 and 9.6% in REM.  Alpha intrusion was absent.  Supine sleep was 0.96%.  RESPIRATORY PARAMETERS The overall apnea/hypopnea index (AHI) was 2.7 per hour. There were 7 total apneas, including 7 obstructive, 0 central and 0 mixed apneas. There were 7 hypopneas and 20 RERAs.  The AHI during Stage REM sleep was 14.0 per hour.  AHI while supine was 0.0 per hour.  The mean oxygen saturation was 94.3%. The minimum SpO2 during sleep was 89.0%.  loud snoring was noted during this study.  CARDIAC DATA The 2 lead EKG demonstrated sinus rhythm. The mean heart rate was 17.8 beats per minute. Other EKG findings include:  None.   LEG MOVEMENT DATA Moderate periodic limb movement disorder is noted.  IMPRESSIONS 1.  Moderate periodic limb movement disorder is noted. 2.  No evidence of sleep disordered breathing is documented. 3.  Reduced slow-wave sleep is also noted.    Delano Metz, MD Diplomate, American Board of Sleep Medicine. ELECTRONICALLY SIGNED ON:  10/17/2019, 10:19 AM [facility] PH: [phone]   FX: [phone] [place]

## 2019-10-23 ENCOUNTER — Other Ambulatory Visit (HOSPITAL_COMMUNITY): Payer: Self-pay | Admitting: Family Medicine

## 2019-10-23 DIAGNOSIS — Z1231 Encounter for screening mammogram for malignant neoplasm of breast: Secondary | ICD-10-CM

## 2019-10-25 ENCOUNTER — Ambulatory Visit (HOSPITAL_COMMUNITY)
Admission: RE | Admit: 2019-10-25 | Discharge: 2019-10-25 | Disposition: A | Discharge status: Home / Self Care | Payer: PPO | Source: Ambulatory Visit | Attending: Family Medicine | Admitting: Family Medicine

## 2019-10-25 ENCOUNTER — Other Ambulatory Visit: Payer: Self-pay

## 2019-10-25 ENCOUNTER — Encounter (HOSPITAL_COMMUNITY): Payer: Self-pay

## 2019-10-25 DIAGNOSIS — Z1231 Encounter for screening mammogram for malignant neoplasm of breast: Secondary | ICD-10-CM | POA: Diagnosis not present

## 2019-10-30 DIAGNOSIS — R5382 Chronic fatigue, unspecified: Secondary | ICD-10-CM | POA: Diagnosis not present

## 2019-10-30 DIAGNOSIS — G3184 Mild cognitive impairment, so stated: Secondary | ICD-10-CM | POA: Diagnosis not present

## 2019-10-30 DIAGNOSIS — G4733 Obstructive sleep apnea (adult) (pediatric): Secondary | ICD-10-CM | POA: Diagnosis not present

## 2019-10-30 DIAGNOSIS — R2681 Unsteadiness on feet: Secondary | ICD-10-CM | POA: Diagnosis not present

## 2019-11-20 ENCOUNTER — Ambulatory Visit (INDEPENDENT_AMBULATORY_CARE_PROVIDER_SITE_OTHER): Payer: PPO | Admitting: Nurse Practitioner

## 2019-11-22 ENCOUNTER — Telehealth (INDEPENDENT_AMBULATORY_CARE_PROVIDER_SITE_OTHER): Payer: Self-pay | Admitting: *Deleted

## 2019-11-22 ENCOUNTER — Encounter (INDEPENDENT_AMBULATORY_CARE_PROVIDER_SITE_OTHER): Payer: Self-pay | Admitting: *Deleted

## 2019-11-22 ENCOUNTER — Other Ambulatory Visit: Payer: Self-pay

## 2019-11-22 ENCOUNTER — Ambulatory Visit (INDEPENDENT_AMBULATORY_CARE_PROVIDER_SITE_OTHER): Payer: PPO | Admitting: Nurse Practitioner

## 2019-11-22 ENCOUNTER — Encounter (INDEPENDENT_AMBULATORY_CARE_PROVIDER_SITE_OTHER): Payer: Self-pay | Admitting: Nurse Practitioner

## 2019-11-22 VITALS — BP 112/72 | HR 86 | Temp 97.3°F | Ht 63.0 in | Wt 171.6 lb

## 2019-11-22 DIAGNOSIS — Z1211 Encounter for screening for malignant neoplasm of colon: Secondary | ICD-10-CM | POA: Insufficient documentation

## 2019-11-22 DIAGNOSIS — R1314 Dysphagia, pharyngoesophageal phase: Secondary | ICD-10-CM | POA: Diagnosis not present

## 2019-11-22 MED ORDER — SUPREP BOWEL PREP KIT 17.5-3.13-1.6 GM/177ML PO SOLN: 1.0000 | Freq: Once | ORAL | 0 refills | Status: AC

## 2019-11-22 NOTE — Progress Notes (Signed)
   Subjective:    Patient ID: Kayla Berg, female    DOB: October 10, 1946, 73 y.o.   MRN: [id number]  HPI Kayla Berg is a 73 year old female with a past medical history significant for arthritis, Sjogren's syndrome and GERD. She presents today for her annual office visit.  She feels as if food just sits in the mid esophagus, however, she can drink water which passes down the esophagus without difficulty which has progressively worsened over the past 6 months.  Decreased appetite. Some nausea. No vomiting. No weight loss. She is taking Pantoprazole 40 once daily and Famotidine 20mg  at bed itme. She has taken Ibuprofen 3 times over the past month. She underwent an EGD in 2003 which showed a small hiatal hernia and gastritis. She has chronic constipation. She is passes a normal formed bowel movement daily if she drinks vegetable juice. She can go 2 days without passing a BM. No rectal bleeding or black stool. She reported completing a colonoscopy 10 years ago.   10/17/2002 by Dr. Laural Golden: 1. Small sliding hiatal hernia 2. No evidence of complicated esophagitis  3. 2 small polyps along the posterior wall of the duodenum, biopsies       revealed inflamed duodenal mucosa and villous architecture was intact.   Review of Systems See HPI, all other systems reviewed and are negative     Objective:   Physical Exam  BP 112/72 (BP Location: Right Arm, Patient Position: Sitting, Cuff Size: Normal)   Pulse 86   Temp (!) 97.3 F (36.3 C) (Oral)   Ht 5\' 3"  (1.6 m)   Wt 171 lb 9.6 oz (77.8 kg)   BMI 30.40 kg/m   General: 73 year old 73 year old female alert in NAD Eyes: sclera nonicteric Mouth: no ulcers or lesions Neck: supple Heart: RRR, no murmurs Lungs: clear throughout Abdomen: soft, nontender, + bowel sounds x 4 quads Extremities: no edema Neuro: alert and oriented x 4, no focal deficits    Assessment & Plan:   100. 73 year old female with a history of Sjogren's syndrome presents  with GERD and dysphagia -EGD  benefits and risks discussed including risk with sedation, risk of bleeding, perforation and infection -Barium swallow to rule out esophageal dysmotility  -Pantoprazole 40mg  in the am and Pepcid 20mg  at bed time -Request copy of most recent CBC and CMP from PCP's office.  2. Constipation -Dulcolax 1 or 2 tabs every third night  3. Colon cancer screening -Colonoscopy benefits and risks discussed   Further follow up to be determined after the above evaluation completed

## 2019-11-22 NOTE — Patient Instructions (Addendum)
1.  Barium swallow 2.  Schedule EGD and colonoscopy      Continue pantoprazole 40 mg in the morning and 20 mg at bedtime 3.  Dulcolax 1 to 2 tablets every third night as needed for constipation 4.  Further follow-up to be determined after the above evaluation completed 5.  Our office will contact your primary care physician for copy of her most recent laboratory results

## 2019-11-22 NOTE — Telephone Encounter (Signed)
Patient needs suprep TCS/EGD sch'd 1/14

## 2019-11-29 ENCOUNTER — Ambulatory Visit (HOSPITAL_COMMUNITY): Payer: PPO

## 2019-12-01 ENCOUNTER — Ambulatory Visit (HOSPITAL_COMMUNITY)
Admission: RE | Admit: 2019-12-01 | Discharge: 2019-12-01 | Disposition: A | Discharge status: Home / Self Care | Payer: PPO | Source: Ambulatory Visit | Attending: Nurse Practitioner | Admitting: Nurse Practitioner

## 2019-12-01 ENCOUNTER — Other Ambulatory Visit: Payer: Self-pay

## 2019-12-01 ENCOUNTER — Other Ambulatory Visit (INDEPENDENT_AMBULATORY_CARE_PROVIDER_SITE_OTHER): Payer: Self-pay | Admitting: Nurse Practitioner

## 2019-12-01 DIAGNOSIS — R1314 Dysphagia, pharyngoesophageal phase: Secondary | ICD-10-CM

## 2019-12-01 DIAGNOSIS — R1310 Dysphagia, unspecified: Secondary | ICD-10-CM | POA: Diagnosis not present

## 2019-12-07 ENCOUNTER — Encounter (INDEPENDENT_AMBULATORY_CARE_PROVIDER_SITE_OTHER): Payer: Self-pay | Admitting: *Deleted

## 2020-01-09 ENCOUNTER — Other Ambulatory Visit (HOSPITAL_COMMUNITY)
Admission: RE | Admit: 2020-01-09 | Discharge: 2020-01-09 | Disposition: A | Discharge status: Home / Self Care | Payer: PPO | Source: Ambulatory Visit | Attending: Internal Medicine | Admitting: Internal Medicine

## 2020-01-09 ENCOUNTER — Other Ambulatory Visit: Payer: Self-pay

## 2020-01-09 ENCOUNTER — Other Ambulatory Visit (HOSPITAL_COMMUNITY): Payer: PPO

## 2020-01-09 DIAGNOSIS — Z01812 Encounter for preprocedural laboratory examination: Secondary | ICD-10-CM | POA: Diagnosis not present

## 2020-01-09 DIAGNOSIS — Z20822 Contact with and (suspected) exposure to covid-19: Secondary | ICD-10-CM | POA: Insufficient documentation

## 2020-01-09 LAB — SARS CORONAVIRUS 2 (TAT 6-24 HRS): SARS Coronavirus 2: NEGATIVE

## 2020-01-11 ENCOUNTER — Other Ambulatory Visit: Payer: Self-pay

## 2020-01-11 ENCOUNTER — Ambulatory Visit (HOSPITAL_COMMUNITY)
Admission: RE | Admit: 2020-01-11 | Discharge: 2020-01-11 | Disposition: A | Discharge status: Home / Self Care | Payer: PPO | Attending: Internal Medicine | Admitting: Internal Medicine

## 2020-01-11 ENCOUNTER — Encounter (HOSPITAL_COMMUNITY): Payer: Self-pay | Admitting: Internal Medicine

## 2020-01-11 ENCOUNTER — Encounter (HOSPITAL_COMMUNITY)
Admission: RE | Disposition: A | Discharge status: Home / Self Care | Payer: Self-pay | Source: Home / Self Care | Attending: Internal Medicine

## 2020-01-11 DIAGNOSIS — K449 Diaphragmatic hernia without obstruction or gangrene: Secondary | ICD-10-CM | POA: Diagnosis not present

## 2020-01-11 DIAGNOSIS — Z885 Allergy status to narcotic agent status: Secondary | ICD-10-CM | POA: Insufficient documentation

## 2020-01-11 DIAGNOSIS — Q394 Esophageal web: Secondary | ICD-10-CM | POA: Insufficient documentation

## 2020-01-11 DIAGNOSIS — K219 Gastro-esophageal reflux disease without esophagitis: Secondary | ICD-10-CM | POA: Insufficient documentation

## 2020-01-11 DIAGNOSIS — Z881 Allergy status to other antibiotic agents status: Secondary | ICD-10-CM | POA: Diagnosis not present

## 2020-01-11 DIAGNOSIS — Z882 Allergy status to sulfonamides status: Secondary | ICD-10-CM | POA: Insufficient documentation

## 2020-01-11 DIAGNOSIS — R1314 Dysphagia, pharyngoesophageal phase: Secondary | ICD-10-CM | POA: Insufficient documentation

## 2020-01-11 DIAGNOSIS — Z79899 Other long term (current) drug therapy: Secondary | ICD-10-CM | POA: Insufficient documentation

## 2020-01-11 DIAGNOSIS — M3500 Sicca syndrome, unspecified: Secondary | ICD-10-CM | POA: Diagnosis not present

## 2020-01-11 DIAGNOSIS — Z1211 Encounter for screening for malignant neoplasm of colon: Secondary | ICD-10-CM | POA: Insufficient documentation

## 2020-01-11 DIAGNOSIS — K9289 Other specified diseases of the digestive system: Secondary | ICD-10-CM | POA: Insufficient documentation

## 2020-01-11 DIAGNOSIS — M1990 Unspecified osteoarthritis, unspecified site: Secondary | ICD-10-CM | POA: Insufficient documentation

## 2020-01-11 DIAGNOSIS — K648 Other hemorrhoids: Secondary | ICD-10-CM | POA: Insufficient documentation

## 2020-01-11 DIAGNOSIS — K644 Residual hemorrhoidal skin tags: Secondary | ICD-10-CM | POA: Diagnosis not present

## 2020-01-11 DIAGNOSIS — D123 Benign neoplasm of transverse colon: Secondary | ICD-10-CM | POA: Diagnosis not present

## 2020-01-11 HISTORY — PX: POLYPECTOMY: SHX5525

## 2020-01-11 HISTORY — PX: COLONOSCOPY: SHX5424

## 2020-01-11 HISTORY — PX: ESOPHAGOGASTRODUODENOSCOPY: SHX5428

## 2020-01-11 SURGERY — EGD (ESOPHAGOGASTRODUODENOSCOPY)
Anesthesia: Moderate Sedation

## 2020-01-11 MED ORDER — LIDOCAINE VISCOUS HCL 2 % MT SOLN
OROMUCOSAL | Status: DC | PRN
  Administered 2020-01-11: 1 via OROMUCOSAL

## 2020-01-11 MED ORDER — MIDAZOLAM HCL 5 MG/5ML IJ SOLN
INTRAMUSCULAR | Status: DC | PRN
  Administered 2020-01-11: 2 mg via INTRAVENOUS
  Administered 2020-01-11 (×6): 1 mg via INTRAVENOUS
  Administered 2020-01-11: 2 mg via INTRAVENOUS

## 2020-01-11 MED ORDER — MEPERIDINE HCL 50 MG/ML IJ SOLN
INTRAMUSCULAR | Status: AC
  Filled 2020-01-11: qty 1

## 2020-01-11 MED ORDER — SODIUM CHLORIDE 0.9 % IV SOLN: INTRAVENOUS | Status: DC

## 2020-01-11 MED ORDER — LIDOCAINE VISCOUS HCL 2 % MT SOLN
OROMUCOSAL | Status: AC
  Filled 2020-01-11: qty 15

## 2020-01-11 MED ORDER — STERILE WATER FOR IRRIGATION IR SOLN
Status: DC | PRN
  Administered 2020-01-11: 11:00:00 1.5 mL

## 2020-01-11 MED ORDER — MEPERIDINE HCL 50 MG/ML IJ SOLN
INTRAMUSCULAR | Status: DC | PRN
  Administered 2020-01-11: 20 mg via INTRAVENOUS
  Administered 2020-01-11 (×2): 25 mg via INTRAVENOUS

## 2020-01-11 MED ORDER — MIDAZOLAM HCL 5 MG/5ML IJ SOLN
INTRAMUSCULAR | Status: AC
  Filled 2020-01-11: qty 10

## 2020-01-11 NOTE — Op Note (Signed)
[facility] Patient Name: Kayla Berg Procedure Date: 01/11/2020 10:50 AM MRN: [id number] Date of Birth: May 29, 1946 Attending MD: Hildred Laser , MD CSN: [id number] Age: 74 Admit Type: Outpatient Procedure:                Upper GI endoscopy Indications:              Esophageal dysphagia, Gastro-esophageal reflux                            disease Providers:                Hildred Laser, MD, Charlsie Quest. Theda Sers RN, RN, Nelma Rothman, Technician Referring MD:             Chapman Fitch, MD Medicines:                Lidocaine spray, Meperidine 50 mg IV, Midazolam 8                            mg IV Complications:            No immediate complications. Estimated Blood Loss:     Estimated blood loss was minimal. Procedure:                Pre-Anesthesia Assessment:                           - Prior to the procedure, a History and Physical                            was performed, and patient medications and                            allergies were reviewed. The patient's tolerance of                            previous anesthesia was also reviewed. The risks                            and benefits of the procedure and the sedation                            options and risks were discussed with the patient.                            All questions were answered, and informed consent                            was obtained. Prior Anticoagulants: The patient has                            taken no previous anticoagulant or antiplatelet                            agents  except for NSAID medication. ASA Grade                            Assessment: III - A patient with severe systemic                            disease. After reviewing the risks and benefits,                            the patient was deemed in satisfactory condition to                            undergo the procedure.                           After obtaining informed consent, the endoscope was                      passed under direct vision. Throughout the                            procedure, the patient's blood pressure, pulse, and                            oxygen saturations were monitored continuously. The                            GIF-H190 [id number]) was introduced through the                            mouth, and advanced to the second part of duodenum.                            The upper GI endoscopy was accomplished without                            difficulty. The patient tolerated the procedure                            well. Scope In: 11:20:35 AM Scope Out: 11:28:47 AM Total Procedure Duration: 0 hours 8 minutes 12 seconds  Findings:      The hypopharynx was normal.      A web was found in the proximal esophagus. The scope was withdrawn.       Dilation was performed with a Maloney dilator with mild resistance at 49       Fr. The dilation site was examined following endoscope reinsertion and       showed mild mucosal disruption and no perforation.      The exam of the esophagus was otherwise normal.      The Z-line was regular and was found 38 cm from the incisors.      A 2 cm hiatal hernia was present.      The duodenal bulb and second portion of the duodenum were normal. Impression:               - Normal hypopharynx.                           -  Web in the proximal esophagus. Dilated.                           - Z-line regular, 38 cm from the incisors.                           - 2 cm hiatal hernia.                           - Normal duodenal bulb and second portion of the                            duodenum.                           - No specimens collected. Moderate Sedation:      Moderate (conscious) sedation was administered by the endoscopy nurse       and supervised by the endoscopist. The following parameters were       monitored: oxygen saturation, heart rate, blood pressure, CO2       capnography and response to care. Total physician intraservice  time was       19 minutes. Recommendation:           - Patient has a contact number available for                            emergencies. The signs and symptoms of potential                            delayed complications were discussed with the                            patient. Return to normal activities tomorrow.                            Written discharge instructions were provided to the                            patient.                           - Mechanical soft diet for 1 day.                           - Continue present medications.                           - No aspirin, ibuprofen, naproxen, or other                            non-steroidal anti-inflammatory drugs for 3 days. Procedure Code(s):        --- Professional ---                           [phone], Esophagogastroduodenoscopy, flexible,  transoral; diagnostic, including collection of                            specimen(s) by brushing or washing, when performed                            (separate procedure)                           43450, Dilation of esophagus, by unguided sound or                            bougie, single or multiple passes                           G0500, Moderate sedation services provided by the                            same physician or other qualified health care                            professional performing a gastrointestinal                            endoscopic service that sedation supports,                            requiring the presence of an independent trained                            observer to assist in the monitoring of the                            patient's level of consciousness and physiological                            status; initial 15 minutes of intra-service time;                            patient age 31 years or older (additional time may                            be reported with [phone], as appropriate) Diagnosis Code(s):         --- Professional ---                           Q39.4, Esophageal web                           K44.9, Diaphragmatic hernia without obstruction or                            gangrene                           R13.14, Dysphagia, pharyngoesophageal  phase                           K21.9, Gastro-esophageal reflux disease without                            esophagitis CPT copyright 2019 [employer]. All rights reserved. The codes documented in this report are preliminary and upon coder review may  be revised to meet current compliance requirements. Hildred Laser, MD Hildred Laser, MD 01/11/2020 12:16:28 PM This report has been signed electronically. Number of Addenda: 0

## 2020-01-11 NOTE — H&P (Addendum)
Kayla Berg is an 74 y.o. female.   Chief Complaint: Patient is here for esophagogastroduodenoscopy with esophageal dilation and colonoscopy. HPI: Patient is 74 year old female with chronic GERD who presents with dysphagia to solids.  She points to lower sternal area.  She feels heartburn is well controlled with therapy.  She had barium study and no abnormality was noted.  She has Sjogren's syndrome.  Therefore concern is that she may have esophageal motility disorder.  She denies sore throat or hoarseness but she has cough at night.  She denies abdominal pain change in bowel habits or rectal bleeding.  She is also undergoing screening colonoscopy.  Last exam was 10 years ago. Family history is negative for CRC.  Past Medical History:  Diagnosis Date  . Arthritis   . GERD (gastroesophageal reflux disease)   . Sjoegren syndrome     Past Surgical History:  Procedure Laterality Date  . TOTAL ABDOMINAL HYSTERECTOMY      Family History  Problem Relation Age of Onset  . Heart disease Mother   . Diabetes Mother   . Heart disease Father        "hole in his heart"   . Lung cancer Brother   . Asthma Brother   . Heart disease Sister   . Stroke Sister    Social History:  reports that she has never smoked. She has never used smokeless tobacco. She reports current alcohol use. She reports that she does not use drugs.  Allergies:  Allergies  Allergen Reactions  . Codeine Hives  . Sulfa Antibiotics Hives    Medications Prior to Admission  Medication Sig Dispense Refill  . alendronate (FOSAMAX) 70 MG tablet Take 70 mg by mouth every Sunday. Take with a full glass of water on an empty stomach.     Marland Kitchen atorvastatin (LIPITOR) 10 MG tablet Take 10 mg by mouth every evening.     . Biotin w/ Vitamins C & E (HAIR SKIN & NAILS GUMMIES PO) Take 2 tablets by mouth daily.    . Calcium Carbonate-Vitamin D ([employer] CALCIUM CARBONATE/VIT D PO) Take 2 tablets by mouth daily.    . chlordiazePOXIDE (LIBRIUM) 25 MG  capsule Take 25 mg by mouth 3 (three) times daily as needed for anxiety.     . famotidine (PEPCID) 20 MG tablet Take 20 mg by mouth at bedtime.    Marland Kitchen FLUoxetine (PROZAC) 40 MG capsule Take 40 mg by mouth daily.    . hydroxychloroquine (PLAQUENIL) 200 MG tablet Take 200 mg by mouth 2 (two) times daily before a meal.     . metoprolol succinate (TOPROL-XL) 25 MG 24 hr tablet Take 12.5 mg by mouth every other day. In the evening.    . Multiple Vitamins-Minerals (ADULT ONE DAILY GUMMIES PO) Take 2 tablets by mouth daily.    . Multiple Vitamins-Minerals (BITOIN PLUS/CALCIUM/VIT D3) TABS Take 2 tablets by mouth daily. Beautiful Hair with Biotin 5000 mcg    . pantoprazole (PROTONIX) 40 MG tablet Take 1 tablet by mouth once daily (Patient taking differently: Take 40 mg by mouth daily. ) 90 tablet 3  . Polyethyl Glycol-Propyl Glycol (LUBRICANT EYE DROPS) 0.4-0.3 % SOLN Place 1 drop into both eyes 3 (three) times daily as needed (dry/irritated eyes.).    Marland Kitchen SUPREP BOWEL PREP KIT 17.5-3.13-1.6 GM/177ML SOLN Take 354 mLs by mouth once.    . triamcinolone cream (KENALOG) 0.1 % Apply 1 application topically daily as needed (eczema (affected area(s) of feet)).     Marland Kitchen  valACYclovir (VALTREX) 500 MG tablet Take 500 mg by mouth every other day.     . ibuprofen (ADVIL,MOTRIN) 200 MG tablet Take 200 mg by mouth every 6 (six) hours as needed for pain.      No results found for this or any previous visit (from the past 48 hour(s)). No results found.  Review of Systems  Blood pressure 106/62, pulse 87, temperature 97.9 F (36.6 C), temperature source Oral, resp. rate (!) 21, height 5' 4"  (1.626 m), SpO2 100 %. Physical Exam  Constitutional: She appears well-developed and well-nourished.  HENT:  Mouth/Throat: Oropharynx is clear and moist.  Eyes: Conjunctivae are normal. No scleral icterus.  Neck: No thyromegaly present.  Cardiovascular: Normal rate, regular rhythm and normal heart sounds.  No murmur  heard. Respiratory: Effort normal and breath sounds normal.  GI: Soft. She exhibits no distension and no mass. There is no abdominal tenderness.  Musculoskeletal:        General: No edema.  Lymphadenopathy:    She has no cervical adenopathy.  Neurological: She is alert.  Skin: Skin is warm and dry.     Assessment/Plan Esophageal dysphagia in patient with chronic GERD. Esophagogastroduodenoscopy with esophageal dilation and average risk screening colonoscopy.  Hildred Laser, MD 01/11/2020, 11:03 AM

## 2020-01-11 NOTE — Op Note (Signed)
[facility] Patient Name: Kayla Berg Procedure Date: 01/11/2020 11:29 AM MRN: [id number] Date of Birth: 06/11/1946 Attending MD: Hildred Laser , MD CSN: [id number] Age: 74 Admit Type: Outpatient Procedure:                Colonoscopy Indications:              Screening for colorectal malignant neoplasm Providers:                Hildred Laser, MD, Selena Lesser RN, RN, Nelma Rothman, Technician Referring MD:             Chapman Fitch, MD Medicines:                Meperidine 20 mg IV, Midazolam 2 mg IV Complications:            No immediate complications. Estimated Blood Loss:     Estimated blood loss was minimal. Procedure:                Pre-Anesthesia Assessment:                           - Prior to the procedure, a History and Physical                            was performed, and patient medications and                            allergies were reviewed. The patient's tolerance of                            previous anesthesia was also reviewed. The risks                            and benefits of the procedure and the sedation                            options and risks were discussed with the patient.                            All questions were answered, and informed consent                            was obtained. Prior Anticoagulants: The patient has                            taken no previous anticoagulant or antiplatelet                            agents except for NSAID medication. ASA Grade                            Assessment: III - A patient with severe systemic  disease. After reviewing the risks and benefits,                            the patient was deemed in satisfactory condition to                            undergo the procedure.                           After obtaining informed consent, the colonoscope                            was passed under direct vision. Throughout the   procedure, the patient's blood pressure, pulse, and                            oxygen saturations were monitored continuously. The                            [id number]-[id number]) scope was introduced through                            the anus and advanced to the the cecum, identified                            by appendiceal orifice and ileocecal valve. The                            colonoscopy was technically difficult and complex                            due to restricted mobility of the colon and a                            redundant colon. Successful completion of the                            procedure was aided by applying abdominal pressure                            and scope guide. The patient tolerated the                            procedure well. The quality of the bowel                            preparation was excellent. The ileocecal valve,                            appendiceal orifice, and rectum were photographed. Scope In: 11:34:40 AM Scope Out: 12:04:37 PM Scope Withdrawal Time: 0 hours 13 minutes 18 seconds  Total Procedure Duration: 0 hours 29 minutes 57 seconds  Findings:      The perianal and digital rectal examinations were normal.  Two polyps were found in the transverse colon and hepatic flexure. The       polyps were 5 mm in size. These polyps were removed with a cold snare.       Resection and retrieval were complete. The pathology specimen was placed       into Bottle Number 1.      A small polyp was found in the distal transverse colon. Biopsies were       taken with a cold forceps for histology. The pathology specimen was       placed into Bottle Number 1.      External and internal hemorrhoids were found during retroflexion. The       hemorrhoids were small. Impression:               - Two 5 mm polyps in the transverse colon and at                            the hepatic flexure, removed with a cold snare.                            Resected  and retrieved.                           - One small polyp in the distal transverse colon.                            Biopsied.                           - External and internal hemorrhoids. Moderate Sedation:      Moderate (conscious) sedation was administered by the endoscopy nurse       and supervised by the endoscopist. The following parameters were       monitored: oxygen saturation, heart rate, blood pressure, CO2       capnography and response to care. Total physician intraservice time was       36 minutes. Recommendation:           - Patient has a contact number available for                            emergencies. The signs and symptoms of potential                            delayed complications were discussed with the                            patient. Return to normal activities tomorrow.                            Written discharge instructions were provided to the                            patient.                           - Mechanical soft diet today.                           -  Continue present medications.                           - Await pathology results.                           - Repeat colonoscopy is recommended. The                            colonoscopy date will be determined after pathology                            results from today's exam become available for                            review.                           - See the other procedure note for documentation of                            additional recommendations. Procedure Code(s):        --- Professional ---                           [phone], Colonoscopy, flexible; with removal of                            tumor(s), polyp(s), or other lesion(s) by snare                            technique                           45380, 59, Colonoscopy, flexible; with biopsy,                            single or multiple                           99153, Moderate sedation; each additional 15                             minutes intraservice time                           G0500, Moderate sedation services provided by the                            same physician or other qualified health care                            professional performing a gastrointestinal                            endoscopic service that sedation supports,  requiring the presence of an independent trained                            observer to assist in the monitoring of the                            patient's level of consciousness and physiological                            status; initial 15 minutes of intra-service time;                            patient age 21 years or older (additional time may                            be reported with [phone], as appropriate) Diagnosis Code(s):        --- Professional ---                           K63.5, Polyp of colon                           Z12.11, Encounter for screening for malignant                            neoplasm of colon                           K64.8, Other hemorrhoids CPT copyright 2019 [employer] Association. All rights reserved. The codes documented in this report are preliminary and upon coder review may  be revised to meet current compliance requirements. Hildred Laser, MD Hildred Laser, MD 01/11/2020 12:21:18 PM This report has been signed electronically. Number of Addenda: 0

## 2020-01-11 NOTE — Discharge Instructions (Signed)
No aspirin or NSAIDs for 3 days. Resume usual medications as before. Mechanical soft diet for 24 hours.  Thereafter usual diet. No driving for 24 hours. Physician will call with biopsy results.   Colonoscopy, Adult, Care After This sheet gives you information about how to care for yourself after your procedure. Your doctor may also give you more specific instructions. If you have problems or questions, call your doctor. What can I expect after the procedure? After the procedure, it is common to have:  A small amount of blood in your poop (stool) for 24 hours.  Some gas.  Mild cramping or bloating in your belly (abdomen). Follow these instructions at home: Eating and drinking   Drink enough fluid to keep your pee (urine) pale yellow.  Follow instructions from your doctor about what you cannot eat or drink.  Return to your normal diet as told by your doctor. Avoid heavy or fried foods that are hard to digest. Activity  Rest as told by your doctor.  Do not sit for a long time without moving. Get up to take short walks every 1-2 hours. This is important. Ask for help if you feel weak or unsteady.  Return to your normal activities as told by your doctor. Ask your doctor what activities are safe for you. To help cramping and bloating:   Try walking around.  Put heat on your belly as told by your doctor. Use the heat source that your doctor recommends, such as a moist heat pack or a heating pad. ? Put a towel between your skin and the heat source. ? Leave the heat on for 20-30 minutes. ? Remove the heat if your skin turns bright red. This is very important if you are unable to feel pain, heat, or cold. You may have a greater risk of getting burned. General instructions  For the first 24 hours after the procedure: ? Do not drive or use machinery. ? Do not sign important documents. ? Do not drink alcohol. ? Do your daily activities more slowly than normal. ? Eat foods that are  soft and easy to digest.  Take over-the-counter or prescription medicines only as told by your doctor.  Keep all follow-up visits as told by your doctor. This is important. Contact a doctor if:  You have blood in your poop 2-3 days after the procedure. Get help right away if:  You have more than a small amount of blood in your poop.  You see large clumps of tissue (blood clots) in your poop.  Your belly is swollen.  You feel like you may vomit (nauseous).  You vomit.  You have a fever.  You have belly pain that gets worse, and medicine does not help your pain. Summary  After the procedure, it is common to have a small amount of blood in your poop. You may also have mild cramping and bloating in your belly.  For the first 24 hours after the procedure, do not drive or use machinery, do not sign important documents, and do not drink alcohol.  Get help right away if you have a lot of blood in your poop, feel like you may vomit, have a fever, or have more belly pain. This information is not intended to replace advice given to you by your health care provider. Make sure you discuss any questions you have with your health care provider. Document Revised: 07/10/2019 Document Reviewed: 07/10/2019 Elsevier Patient Education  [place].  Upper Endoscopy, Adult, Care  After This sheet gives you information about how to care for yourself after your procedure. Your health care provider may also give you more specific instructions. If you have problems or questions, contact your health care provider. What can I expect after the procedure? After the procedure, it is common to have:  A sore throat.  Mild stomach pain or discomfort.  Bloating.  Nausea. Follow these instructions at home:   Follow instructions from your health care provider about what to eat or drink after your procedure.  Return to your normal activities as told by your health care provider. Ask your health  care provider what activities are safe for you.  Take over-the-counter and prescription medicines only as told by your health care provider.  Do not drive for 24 hours if you were given a sedative during your procedure.  Keep all follow-up visits as told by your health care provider. This is important. Contact a health care provider if you have:  A sore throat that lasts longer than one day.  Trouble swallowing. Get help right away if:  You vomit blood or your vomit looks like coffee grounds.  You have: ? A fever. ? Bloody, black, or tarry stools. ? A severe sore throat or you cannot swallow. ? Difficulty breathing. ? Severe pain in your chest or abdomen. Summary  After the procedure, it is common to have a sore throat, mild stomach discomfort, bloating, and nausea.  Do not drive for 24 hours if you were given a sedative during the procedure.  Follow instructions from your health care provider about what to eat or drink after your procedure.  Return to your normal activities as told by your health care provider. This information is not intended to replace advice given to you by your health care provider. Make sure you discuss any questions you have with your health care provider. Document Revised: 06/07/2018 Document Reviewed: 05/16/2018 Elsevier Patient Education  [place].  Colon Polyps  Polyps are tissue growths inside the body. Polyps can grow in many places, including the large intestine (colon). A polyp may be a round bump or a mushroom-shaped growth. You could have one polyp or several. Most colon polyps are noncancerous (benign). However, some colon polyps can become cancerous over time. Finding and removing the polyps early can help prevent this. What are the causes? The exact cause of colon polyps is not known. What increases the risk? You are more likely to develop this condition if you:  Have a family history of colon cancer or colon polyps.  Are  older than 26 or older than 45 if you are African American.  Have inflammatory bowel disease, such as ulcerative colitis or Crohn's disease.  Have certain hereditary conditions, such as: ? Familial adenomatous polyposis. ? Lynch syndrome. ? Turcot syndrome. ? Peutz-Jeghers syndrome.  Are overweight.  Smoke cigarettes.  Do not get enough exercise.  Drink too much alcohol.  Eat a diet that is high in fat and red meat and low in fiber.  Had childhood cancer that was treated with abdominal radiation. What are the signs or symptoms? Most polyps do not cause symptoms. If you have symptoms, they may include:  Blood coming from your rectum when having a bowel movement.  Blood in your stool. The stool may look dark red or black.  Abdominal pain.  A change in bowel habits, such as constipation or diarrhea. How is this diagnosed? This condition is diagnosed with a colonoscopy. This is a procedure  in which a lighted, flexible scope is inserted into the anus and then passed into the colon to examine the area. Polyps are sometimes found when a colonoscopy is done as part of routine cancer screening tests. How is this treated? Treatment for this condition involves removing any polyps that are found. Most polyps can be removed during a colonoscopy. Those polyps will then be tested for cancer. Additional treatment may be needed depending on the results of testing. Follow these instructions at home: Lifestyle  Maintain a healthy weight, or lose weight if recommended by your health care provider.  Exercise every day or as told by your health care provider.  Do not use any products that contain nicotine or tobacco, such as cigarettes and e-cigarettes. If you need help quitting, ask your health care provider.  If you drink alcohol, limit how much you have: ? 0-1 drink a day for women. ? 0-2 drinks a day for men.  Be aware of how much alcohol is in your drink. In the [place] one drink equals  one 12 oz bottle of beer (355 mL), one 5 oz glass of wine (148 mL), or one 1 oz shot of hard liquor (44 mL). Eating and drinking   Eat foods that are high in fiber, such as fruits, vegetables, and whole grains.  Eat foods that are high in calcium and vitamin D, such as milk, cheese, yogurt, eggs, liver, fish, and broccoli.  Limit foods that are high in fat, such as fried foods and desserts.  Limit the amount of red meat and processed meat you eat, such as hot dogs, sausage, bacon, and lunch meats. General instructions  Keep all follow-up visits as told by your health care provider. This is important. ? This includes having regularly scheduled colonoscopies. ? Talk to your health care provider about when you need a colonoscopy. Contact a health care provider if:  You have new or worsening bleeding during a bowel movement.  You have new or increased blood in your stool.  You have a change in bowel habits.  You lose weight for no known reason. Summary  Polyps are tissue growths inside the body. Polyps can grow in many places, including the colon.  Most colon polyps are noncancerous (benign), but some can become cancerous over time.  This condition is diagnosed with a colonoscopy.  Treatment for this condition involves removing any polyps that are found. Most polyps can be removed during a colonoscopy. This information is not intended to replace advice given to you by your health care provider. Make sure you discuss any questions you have with your health care provider. Document Revised: 03/31/2018 Document Reviewed: 03/31/2018 Elsevier Patient Education  [place], Adult     A hernia happens when tissue inside your body pushes out through a weak spot in your belly muscles (abdominal wall). This makes a round lump (bulge). The lump may be:  In a scar from surgery that was done in your belly (incisional hernia).  Near your belly button (umbilical  hernia).  In your groin (inguinal hernia). Your groin is the area where your leg meets your lower belly (abdomen). This kind of hernia could also be: ? In your scrotum, if you are female. ? In folds of skin around your vagina, if you are female.  In your upper thigh (femoral hernia).  Inside your belly (hiatal hernia). This happens when your stomach slides above the muscle between your belly and your chest (diaphragm). If your hernia  is small and it does not cause pain, you may not need treatment. If your hernia is large or it causes pain, you may need surgery. Follow these instructions at home: Activity  Avoid stretching or overusing (straining) the muscles near your hernia. Straining can happen when you: ? Lift something heavy. ? Poop (have a bowel movement).  Do not lift anything that is heavier than 10 lb (4.5 kg), or the limit that you are told, until your doctor says that it is safe.  Use the strength of your legs when you lift something heavy. Do not use only your back muscles to lift. General instructions  Do these things if told by your doctor so you do not have trouble pooping (constipation): ? Drink enough fluid to keep your pee (urine) pale yellow. ? Eat foods that are high in fiber. These include fresh fruits and vegetables, whole grains, and beans. ? Limit foods that are high in fat and processed sugars. These include foods that are fried or sweet. ? Take medicine for trouble pooping.  When you cough, try to cough gently.  You may try to push your hernia in by very gently pressing on it when you are lying down. Do not try to force the bulge back in if it will not push in easily.  If you are overweight, work with your doctor to lose weight safely.  Do not use any products that have nicotine or tobacco in them. These include cigarettes and e-cigarettes. If you need help quitting, ask your doctor.  If you will be having surgery (hernia repair), watch your hernia for  changes in shape, size, or color. Tell your doctor if you see any changes.  Take over-the-counter and prescription medicines only as told by your doctor.  Keep all follow-up visits as told by your doctor. Contact a doctor if:  You get new pain, swelling, or redness near your hernia.  You poop fewer times in a week than normal.  You have trouble pooping.  You have poop (stool) that is more dry than normal.  You have poop that is harder or larger than normal. Get help right away if:  You have a fever.  You have belly pain that gets worse.  You feel sick to your stomach (nauseous).  You throw up (vomit).  Your hernia cannot be pushed in by very gently pressing on it when you are lying down. Do not try to force the bulge back in if it will not push in easily.  Your hernia: ? Changes in shape or size. ? Changes color. ? Feels hard or it hurts when you touch it. These symptoms may represent a serious problem that is an emergency. Do not wait to see if the symptoms will go away. Get medical help right away. Call your local emergency services (911 in the [place] Summary  A hernia happens when tissue inside your body pushes out through a weak spot in the belly muscles. This creates a bulge.  If your hernia is small and it does not hurt, you may not need treatment. If your hernia is large or it hurts, you may need surgery.  If you will be having surgery, watch your hernia for changes in shape, size, or color. Tell your doctor about any changes. This information is not intended to replace advice given to you by your health care provider. Make sure you discuss any questions you have with your health care provider. Document Revised: 04/06/2019 Document Reviewed: 09/15/2017 Elsevier Patient  Education  2020 Elsevier Inc.  

## 2020-01-12 LAB — SURGICAL PATHOLOGY

## 2020-01-22 DIAGNOSIS — K219 Gastro-esophageal reflux disease without esophagitis: Secondary | ICD-10-CM | POA: Diagnosis not present

## 2020-01-22 DIAGNOSIS — Z23 Encounter for immunization: Secondary | ICD-10-CM | POA: Diagnosis not present

## 2020-01-22 DIAGNOSIS — Z0000 Encounter for general adult medical examination without abnormal findings: Secondary | ICD-10-CM | POA: Diagnosis not present

## 2020-01-22 DIAGNOSIS — I10 Essential (primary) hypertension: Secondary | ICD-10-CM | POA: Diagnosis not present

## 2020-01-22 DIAGNOSIS — R7302 Impaired glucose tolerance (oral): Secondary | ICD-10-CM | POA: Diagnosis not present

## 2020-02-14 DIAGNOSIS — Z6830 Body mass index (BMI) 30.0-30.9, adult: Secondary | ICD-10-CM | POA: Diagnosis not present

## 2020-02-14 DIAGNOSIS — E669 Obesity, unspecified: Secondary | ICD-10-CM | POA: Diagnosis not present

## 2020-02-14 DIAGNOSIS — M3500 Sicca syndrome, unspecified: Secondary | ICD-10-CM | POA: Diagnosis not present

## 2020-02-14 DIAGNOSIS — Z79899 Other long term (current) drug therapy: Secondary | ICD-10-CM | POA: Diagnosis not present

## 2020-02-14 DIAGNOSIS — M150 Primary generalized (osteo)arthritis: Secondary | ICD-10-CM | POA: Diagnosis not present

## 2020-02-14 DIAGNOSIS — M2550 Pain in unspecified joint: Secondary | ICD-10-CM | POA: Diagnosis not present

## 2020-02-14 DIAGNOSIS — M5136 Other intervertebral disc degeneration, lumbar region: Secondary | ICD-10-CM | POA: Diagnosis not present

## 2020-03-06 DIAGNOSIS — H04123 Dry eye syndrome of bilateral lacrimal glands: Secondary | ICD-10-CM | POA: Diagnosis not present

## 2020-03-06 DIAGNOSIS — Z79899 Other long term (current) drug therapy: Secondary | ICD-10-CM | POA: Diagnosis not present

## 2020-03-06 DIAGNOSIS — M3500 Sicca syndrome, unspecified: Secondary | ICD-10-CM | POA: Diagnosis not present

## 2020-03-06 DIAGNOSIS — H40013 Open angle with borderline findings, low risk, bilateral: Secondary | ICD-10-CM | POA: Diagnosis not present

## 2020-04-23 DIAGNOSIS — R5383 Other fatigue: Secondary | ICD-10-CM | POA: Diagnosis not present

## 2020-04-23 DIAGNOSIS — R413 Other amnesia: Secondary | ICD-10-CM | POA: Diagnosis not present

## 2020-04-23 DIAGNOSIS — R2681 Unsteadiness on feet: Secondary | ICD-10-CM | POA: Diagnosis not present

## 2020-04-23 DIAGNOSIS — G3184 Mild cognitive impairment, so stated: Secondary | ICD-10-CM | POA: Diagnosis not present

## 2020-06-12 ENCOUNTER — Other Ambulatory Visit (INDEPENDENT_AMBULATORY_CARE_PROVIDER_SITE_OTHER): Payer: Self-pay | Admitting: Gastroenterology

## 2020-06-12 DIAGNOSIS — K219 Gastro-esophageal reflux disease without esophagitis: Secondary | ICD-10-CM

## 2020-06-12 MED ORDER — PANTOPRAZOLE SODIUM 40 MG PO TBEC: 40.0000 mg | DELAYED_RELEASE_TABLET | Freq: Every day | ORAL | 1 refills | Status: DC

## 2020-06-12 NOTE — Progress Notes (Signed)
Refill for Protonix sent to pharmacy per request.

## 2020-08-01 DIAGNOSIS — R0981 Nasal congestion: Secondary | ICD-10-CM | POA: Diagnosis not present

## 2020-08-13 DIAGNOSIS — M2550 Pain in unspecified joint: Secondary | ICD-10-CM | POA: Diagnosis not present

## 2020-08-13 DIAGNOSIS — M5136 Other intervertebral disc degeneration, lumbar region: Secondary | ICD-10-CM | POA: Diagnosis not present

## 2020-08-13 DIAGNOSIS — M3500 Sicca syndrome, unspecified: Secondary | ICD-10-CM | POA: Diagnosis not present

## 2020-08-13 DIAGNOSIS — M150 Primary generalized (osteo)arthritis: Secondary | ICD-10-CM | POA: Diagnosis not present

## 2020-08-13 DIAGNOSIS — Z79899 Other long term (current) drug therapy: Secondary | ICD-10-CM | POA: Diagnosis not present

## 2020-09-06 DIAGNOSIS — H40013 Open angle with borderline findings, low risk, bilateral: Secondary | ICD-10-CM | POA: Diagnosis not present

## 2020-09-06 DIAGNOSIS — M3500 Sicca syndrome, unspecified: Secondary | ICD-10-CM | POA: Diagnosis not present

## 2020-09-06 DIAGNOSIS — Z79899 Other long term (current) drug therapy: Secondary | ICD-10-CM | POA: Diagnosis not present

## 2020-09-06 DIAGNOSIS — H04123 Dry eye syndrome of bilateral lacrimal glands: Secondary | ICD-10-CM | POA: Diagnosis not present

## 2020-10-01 DIAGNOSIS — R0982 Postnasal drip: Secondary | ICD-10-CM | POA: Diagnosis not present

## 2020-10-01 DIAGNOSIS — Z23 Encounter for immunization: Secondary | ICD-10-CM | POA: Diagnosis not present

## 2020-10-01 DIAGNOSIS — R059 Cough, unspecified: Secondary | ICD-10-CM | POA: Diagnosis not present

## 2020-10-01 DIAGNOSIS — E782 Mixed hyperlipidemia: Secondary | ICD-10-CM | POA: Diagnosis not present

## 2020-10-01 DIAGNOSIS — J8410 Pulmonary fibrosis, unspecified: Secondary | ICD-10-CM | POA: Diagnosis not present

## 2020-10-01 DIAGNOSIS — B009 Herpesviral infection, unspecified: Secondary | ICD-10-CM | POA: Diagnosis not present

## 2020-10-01 DIAGNOSIS — F32A Depression, unspecified: Secondary | ICD-10-CM | POA: Diagnosis not present

## 2020-10-01 DIAGNOSIS — F419 Anxiety disorder, unspecified: Secondary | ICD-10-CM | POA: Diagnosis not present

## 2020-10-21 DIAGNOSIS — R2681 Unsteadiness on feet: Secondary | ICD-10-CM | POA: Diagnosis not present

## 2020-10-21 DIAGNOSIS — R413 Other amnesia: Secondary | ICD-10-CM | POA: Diagnosis not present

## 2020-10-21 DIAGNOSIS — F32A Depression, unspecified: Secondary | ICD-10-CM | POA: Diagnosis not present

## 2020-10-21 DIAGNOSIS — R5383 Other fatigue: Secondary | ICD-10-CM | POA: Diagnosis not present

## 2020-10-21 DIAGNOSIS — G3184 Mild cognitive impairment, so stated: Secondary | ICD-10-CM | POA: Diagnosis not present

## 2020-11-05 DIAGNOSIS — R0982 Postnasal drip: Secondary | ICD-10-CM | POA: Diagnosis not present

## 2020-11-05 DIAGNOSIS — F32A Depression, unspecified: Secondary | ICD-10-CM | POA: Diagnosis not present

## 2020-11-05 DIAGNOSIS — F419 Anxiety disorder, unspecified: Secondary | ICD-10-CM | POA: Diagnosis not present

## 2020-11-14 DIAGNOSIS — Z1231 Encounter for screening mammogram for malignant neoplasm of breast: Secondary | ICD-10-CM | POA: Diagnosis not present

## 2020-12-12 DIAGNOSIS — R109 Unspecified abdominal pain: Secondary | ICD-10-CM | POA: Diagnosis not present

## 2020-12-12 DIAGNOSIS — N898 Other specified noninflammatory disorders of vagina: Secondary | ICD-10-CM | POA: Diagnosis not present

## 2020-12-16 ENCOUNTER — Other Ambulatory Visit (INDEPENDENT_AMBULATORY_CARE_PROVIDER_SITE_OTHER): Payer: Self-pay

## 2020-12-16 DIAGNOSIS — K219 Gastro-esophageal reflux disease without esophagitis: Secondary | ICD-10-CM

## 2020-12-16 MED ORDER — PANTOPRAZOLE SODIUM 40 MG PO TBEC: 40.0000 mg | DELAYED_RELEASE_TABLET | Freq: Every day | ORAL | 1 refills | Status: DC

## 2021-01-11 DIAGNOSIS — Z20822 Contact with and (suspected) exposure to covid-19: Secondary | ICD-10-CM | POA: Diagnosis not present

## 2021-01-23 DIAGNOSIS — F32A Depression, unspecified: Secondary | ICD-10-CM | POA: Diagnosis not present

## 2021-01-23 DIAGNOSIS — I10 Essential (primary) hypertension: Secondary | ICD-10-CM | POA: Diagnosis not present

## 2021-01-23 DIAGNOSIS — F419 Anxiety disorder, unspecified: Secondary | ICD-10-CM | POA: Diagnosis not present

## 2021-01-23 DIAGNOSIS — Z136 Encounter for screening for cardiovascular disorders: Secondary | ICD-10-CM | POA: Diagnosis not present

## 2021-01-23 DIAGNOSIS — Z0000 Encounter for general adult medical examination without abnormal findings: Secondary | ICD-10-CM | POA: Diagnosis not present

## 2021-01-23 DIAGNOSIS — Z1329 Encounter for screening for other suspected endocrine disorder: Secondary | ICD-10-CM | POA: Diagnosis not present

## 2021-01-23 DIAGNOSIS — R7302 Impaired glucose tolerance (oral): Secondary | ICD-10-CM | POA: Diagnosis not present

## 2021-01-23 DIAGNOSIS — R350 Frequency of micturition: Secondary | ICD-10-CM | POA: Diagnosis not present

## 2021-01-23 DIAGNOSIS — Z13220 Encounter for screening for lipoid disorders: Secondary | ICD-10-CM | POA: Diagnosis not present

## 2021-02-13 DIAGNOSIS — Z6827 Body mass index (BMI) 27.0-27.9, adult: Secondary | ICD-10-CM | POA: Diagnosis not present

## 2021-02-13 DIAGNOSIS — M2550 Pain in unspecified joint: Secondary | ICD-10-CM | POA: Diagnosis not present

## 2021-02-13 DIAGNOSIS — M3500 Sicca syndrome, unspecified: Secondary | ICD-10-CM | POA: Diagnosis not present

## 2021-02-13 DIAGNOSIS — Z79899 Other long term (current) drug therapy: Secondary | ICD-10-CM | POA: Diagnosis not present

## 2021-02-13 DIAGNOSIS — M150 Primary generalized (osteo)arthritis: Secondary | ICD-10-CM | POA: Diagnosis not present

## 2021-02-13 DIAGNOSIS — E663 Overweight: Secondary | ICD-10-CM | POA: Diagnosis not present

## 2021-02-13 DIAGNOSIS — M5136 Other intervertebral disc degeneration, lumbar region: Secondary | ICD-10-CM | POA: Diagnosis not present

## 2021-02-13 DIAGNOSIS — R0609 Other forms of dyspnea: Secondary | ICD-10-CM | POA: Diagnosis not present

## 2021-02-21 DIAGNOSIS — R9431 Abnormal electrocardiogram [ECG] [EKG]: Secondary | ICD-10-CM | POA: Diagnosis not present

## 2021-02-21 DIAGNOSIS — R0600 Dyspnea, unspecified: Secondary | ICD-10-CM | POA: Diagnosis not present

## 2021-02-21 DIAGNOSIS — F419 Anxiety disorder, unspecified: Secondary | ICD-10-CM | POA: Diagnosis not present

## 2021-02-21 DIAGNOSIS — R531 Weakness: Secondary | ICD-10-CM | POA: Diagnosis not present

## 2021-02-21 DIAGNOSIS — Z20822 Contact with and (suspected) exposure to covid-19: Secondary | ICD-10-CM | POA: Diagnosis not present

## 2021-02-21 DIAGNOSIS — K219 Gastro-esophageal reflux disease without esophagitis: Secondary | ICD-10-CM | POA: Diagnosis not present

## 2021-02-21 DIAGNOSIS — R0602 Shortness of breath: Secondary | ICD-10-CM | POA: Diagnosis not present

## 2021-02-21 DIAGNOSIS — I10 Essential (primary) hypertension: Secondary | ICD-10-CM | POA: Diagnosis not present

## 2021-03-12 DIAGNOSIS — Z79899 Other long term (current) drug therapy: Secondary | ICD-10-CM | POA: Diagnosis not present

## 2021-03-12 DIAGNOSIS — H40013 Open angle with borderline findings, low risk, bilateral: Secondary | ICD-10-CM | POA: Diagnosis not present

## 2021-03-12 DIAGNOSIS — H04123 Dry eye syndrome of bilateral lacrimal glands: Secondary | ICD-10-CM | POA: Diagnosis not present

## 2021-03-12 DIAGNOSIS — H524 Presbyopia: Secondary | ICD-10-CM | POA: Diagnosis not present

## 2021-03-12 DIAGNOSIS — M3500 Sicca syndrome, unspecified: Secondary | ICD-10-CM | POA: Diagnosis not present

## 2021-03-18 DIAGNOSIS — R9431 Abnormal electrocardiogram [ECG] [EKG]: Secondary | ICD-10-CM | POA: Diagnosis not present

## 2021-03-18 DIAGNOSIS — R0600 Dyspnea, unspecified: Secondary | ICD-10-CM | POA: Diagnosis not present

## 2021-03-18 DIAGNOSIS — R002 Palpitations: Secondary | ICD-10-CM | POA: Diagnosis not present

## 2021-03-28 DIAGNOSIS — I5189 Other ill-defined heart diseases: Secondary | ICD-10-CM | POA: Diagnosis not present

## 2021-03-28 DIAGNOSIS — R0600 Dyspnea, unspecified: Secondary | ICD-10-CM | POA: Diagnosis not present

## 2021-03-28 DIAGNOSIS — I517 Cardiomegaly: Secondary | ICD-10-CM | POA: Diagnosis not present

## 2021-04-07 DIAGNOSIS — F32A Depression, unspecified: Secondary | ICD-10-CM | POA: Diagnosis not present

## 2021-04-07 DIAGNOSIS — R2681 Unsteadiness on feet: Secondary | ICD-10-CM | POA: Diagnosis not present

## 2021-04-07 DIAGNOSIS — R5383 Other fatigue: Secondary | ICD-10-CM | POA: Diagnosis not present

## 2021-04-07 DIAGNOSIS — R413 Other amnesia: Secondary | ICD-10-CM | POA: Diagnosis not present

## 2021-04-07 DIAGNOSIS — G3184 Mild cognitive impairment, so stated: Secondary | ICD-10-CM | POA: Diagnosis not present

## 2021-04-08 DIAGNOSIS — R002 Palpitations: Secondary | ICD-10-CM | POA: Diagnosis not present

## 2021-04-22 ENCOUNTER — Encounter (INDEPENDENT_AMBULATORY_CARE_PROVIDER_SITE_OTHER): Payer: Self-pay | Admitting: Internal Medicine

## 2021-04-22 ENCOUNTER — Ambulatory Visit (INDEPENDENT_AMBULATORY_CARE_PROVIDER_SITE_OTHER): Payer: PPO | Admitting: Internal Medicine

## 2021-04-22 ENCOUNTER — Other Ambulatory Visit: Payer: Self-pay

## 2021-04-22 VITALS — BP 113/71 | HR 89 | Temp 98.0°F | Ht 63.0 in | Wt 156.6 lb

## 2021-04-22 DIAGNOSIS — K219 Gastro-esophageal reflux disease without esophagitis: Secondary | ICD-10-CM | POA: Diagnosis not present

## 2021-04-22 MED ORDER — SUCRALFATE 1 G PO TABS: 2.0000 g | ORAL_TABLET | Freq: Every day | ORAL | 5 refills | Status: DC

## 2021-04-22 NOTE — Progress Notes (Signed)
Presenting complaint;  Epigastric pain, cough and throat symptoms at night.  Database and subjective:  Patient is 75 year old African-American female who has chronic GERD as well as a history of dysphagia who is here for scheduled visit.  She also has Sjogren's syndrome. EGD in January 2021 revealed small sliding hiatal hernia and esophageal valve and she responded to esophageal dilation. She has been maintained on pantoprazole. She now presents with intermittent epigastric pain heartburn and nocturnal regurgitation which wakes her up.  This symptom occurs at least 3-4 times a week.  On few occasions that she vomited and vomitus consisted of bile but no fluid or blood.  She states she eats supper late and sometimes as late as 9 PM but she does not go to bed until 2 AM and wakes up at noon the next day.  She is not having any dysphagia at this time.  She drinks coffee every now and then.  She stays away from foods that trigger heartburn. She says her symptoms got worse after she had COVID.  She also has lost 15 pounds since then.  Bowels move daily and she denies melena or rectal bleeding.  Current Medications: Outpatient Encounter Medications as of 04/22/2021  Medication Sig  . alendronate (FOSAMAX) 70 MG tablet Take 70 mg by mouth every Sunday. Take with a full glass of water on an empty stomach.  Marland Kitchen atorvastatin (LIPITOR) 10 MG tablet Take 10 mg by mouth every evening.   . Biotin w/ Vitamins C & E (HAIR SKIN & NAILS GUMMIES PO) Take 2 tablets by mouth daily.  . Calcium Carbonate-Vitamin D ([employer] CALCIUM CARBONATE/VIT D PO) Take 2 tablets by mouth daily.  . chlordiazePOXIDE (LIBRIUM) 25 MG capsule Take 25 mg by mouth 3 (three) times daily as needed for anxiety.   . famotidine (PEPCID) 20 MG tablet Take 20 mg by mouth at bedtime.  Marland Kitchen FLUoxetine (PROZAC) 40 MG capsule Take 40 mg by mouth daily.  . hydroxychloroquine (PLAQUENIL) 200 MG tablet Take 200 mg by mouth 2 (two) times daily before a meal.    . ibuprofen (ADVIL,MOTRIN) 200 MG tablet Take 200 mg by mouth every 6 (six) hours as needed for pain.  . Multiple Vitamins-Minerals (ADULT ONE DAILY GUMMIES PO) Take 2 tablets by mouth daily.  . Multiple Vitamins-Minerals (BITOIN PLUS/CALCIUM/VIT D3) TABS Take 2 tablets by mouth daily. Beautiful Hair with Biotin 5000 mcg  . pantoprazole (PROTONIX) 40 MG tablet Take 1 tablet (40 mg total) by mouth daily.  Manus Gunning BOWEL PREP KIT 17.5-3.13-1.6 GM/177ML SOLN Take 354 mLs by mouth once.  . triamcinolone cream (KENALOG) 0.1 % Apply 1 application topically daily as needed (eczema (affected area(s) of feet)).   . valACYclovir (VALTREX) 500 MG tablet Take 500 mg by mouth every other day.   . metoprolol succinate (TOPROL-XL) 25 MG 24 hr tablet Take 12.5 mg by mouth every other day. In the evening. (Patient not taking: Reported on 04/22/2021)  . Polyethyl Glycol-Propyl Glycol (LUBRICANT EYE DROPS) 0.4-0.3 % SOLN Place 1 drop into both eyes 3 (three) times daily as needed (dry/irritated eyes.).   No facility-administered encounter medications on file as of 04/22/2021.     Objective: Blood pressure 113/71, pulse 89, temperature 98 F (36.7 C), temperature source Oral, height 5' 3"  (1.6 m), weight 156 lb 9.6 oz (71 kg). Patient is alert and in no acute distress. She is wearing a mask. Conjunctiva is pink. Sclera is nonicteric Oropharyngeal mucosa is normal. No neck masses or thyromegaly noted.  Cardiac exam with regular rhythm normal S1 and S2. No murmur or gallop noted. Lungs are clear to auscultation. Abdomen is symmetrical.  On palpation is soft.  She has mild midepigastric tenderness.  No organomegaly or masses. No LE edema or clubbing noted.  Labs/studies Results:  CBC Latest Ref Rng & Units 05/03/2013  WBC 3.4 - 10.8 x10E3/uL 4.8  Hemoglobin 11.1 - 15.9 g/dL 13.3  Hematocrit 34.0 - 46.6 % 39.0  Platelets 155 - 379 x10E3/uL 288    CMP Latest Ref Rng & Units 02/12/2016 05/03/2013  Glucose 65 -  99 mg/dL 95 89  BUN 7 - 25 mg/dL 17 13  Creatinine 0.60 - 0.93 mg/dL 0.97(H) 0.88  Sodium 135 - 146 mmol/L 136 141  Potassium 3.5 - 5.3 mmol/L 4.2 4.5  Chloride 98 - 110 mmol/L 102 104  CO2 20 - 31 mmol/L 29 25  Calcium 8.6 - 10.4 mg/dL 9.6 9.7  Total Protein 6.0 - 8.5 g/dL - 6.7  Total Bilirubin 0.0 - 1.2 mg/dL - 0.4  Alkaline Phos 39 - 117 IU/L - 71  AST 0 - 40 IU/L - 21  ALT 0 - 32 IU/L - 7    Hepatic Function Latest Ref Rng & Units 05/03/2013  Total Protein 6.0 - 8.5 g/dL 6.7  Albumin 3.6 - 4.8 g/dL 4.1  AST 0 - 40 IU/L 21  ALT 0 - 32 IU/L 7  Alk Phosphatase 39 - 117 IU/L 71  Total Bilirubin 0.0 - 1.2 mg/dL 0.4      Assessment:  #1.  GERD.  She is having nocturnal symptoms.  She is on single dose PPI.  On few occasion she has vomited bile.  Therefore I wonder if her symptoms are due to bile reflux rather than acid induced symptoms.  She also would benefit from eating supper already than 9 PM and also raising head of her bed by 4 to 6 inches or using wedge pillow. She has Sjogren's syndrome and therefore may have developed esophageal motility disorder causing worsening of her symptoms.  #2.  Epigastric pain.  This may be due to duodenal gastric bile reflux.  EGD and January 2021 was negative for peptic ulcer disease.  Plan:  Antireflux measures reinforced. Patient advised to eat supper may be between 6 and 7 PM. Continue pantoprazole 40 mg by mouth 30 minutes before breakfast and famotidine 20 mg after supper. Sucralfate 2 g p.o. nightly. Patient will call with progress report in 2 weeks.  If she does not feel better with proceed with esophagogastroduodenoscopy followed by gastric emptying. Office visit in 1 year.

## 2021-04-22 NOTE — Patient Instructions (Addendum)
Antireflux measures as discussed including using a wedge pillow Symptom diary as to frequency of regurgitation episodes for 1 month and call with progress report. Call in 2 weeks if sucralfate does not help.

## 2021-04-28 DIAGNOSIS — I10 Essential (primary) hypertension: Secondary | ICD-10-CM | POA: Diagnosis not present

## 2021-05-15 ENCOUNTER — Other Ambulatory Visit (HOSPITAL_COMMUNITY): Payer: Self-pay | Admitting: Radiology

## 2021-05-15 DIAGNOSIS — R0600 Dyspnea, unspecified: Secondary | ICD-10-CM

## 2021-05-20 DIAGNOSIS — Z01812 Encounter for preprocedural laboratory examination: Secondary | ICD-10-CM | POA: Diagnosis not present

## 2021-05-20 DIAGNOSIS — Z20822 Contact with and (suspected) exposure to covid-19: Secondary | ICD-10-CM | POA: Diagnosis not present

## 2021-05-23 ENCOUNTER — Other Ambulatory Visit: Payer: Self-pay

## 2021-05-23 ENCOUNTER — Ambulatory Visit (HOSPITAL_COMMUNITY)
Admission: RE | Admit: 2021-05-23 | Discharge: 2021-05-23 | Disposition: A | Discharge status: Home / Self Care | Payer: PPO | Source: Ambulatory Visit | Attending: Family Medicine | Admitting: Family Medicine

## 2021-05-23 DIAGNOSIS — R0600 Dyspnea, unspecified: Secondary | ICD-10-CM | POA: Diagnosis not present

## 2021-05-23 LAB — PULMONARY FUNCTION TEST
DL/VA % pred: 89 %
DL/VA: 3.67 ml/min/mmHg/L
DLCO unc % pred: 76 %
DLCO unc: 13.97 ml/min/mmHg
FEF 25-75 Post: 2.83 L/sec
FEF 25-75 Pre: 2.26 L/sec
FEF2575-%Change-Post: 24 %
FEF2575-%Pred-Post: 208 %
FEF2575-%Pred-Pre: 166 %
FEV1-%Change-Post: 4 %
FEV1-%Pred-Post: 126 %
FEV1-%Pred-Pre: 121 %
FEV1-Post: 1.97 L
FEV1-Pre: 1.89 L
FEV1FVC-%Change-Post: 3 %
FEV1FVC-%Pred-Pre: 110 %
FEV6-%Change-Post: 0 %
FEV6-%Pred-Post: 117 %
FEV6-%Pred-Pre: 116 %
FEV6-Post: 2.27 L
FEV6-Pre: 2.25 L
FEV6FVC-%Pred-Post: 104 %
FEV6FVC-%Pred-Pre: 104 %
FVC-%Change-Post: 0 %
FVC-%Pred-Post: 112 %
FVC-%Pred-Pre: 111 %
FVC-Post: 2.27 L
FVC-Pre: 2.25 L
Post FEV1/FVC ratio: 87 %
Post FEV6/FVC ratio: 100 %
Pre FEV1/FVC ratio: 84 %
Pre FEV6/FVC Ratio: 100 %
RV % pred: 86 %
RV: 1.97 L
TLC % pred: 90 %
TLC: 4.44 L

## 2021-05-23 MED ORDER — ALBUTEROL SULFATE (2.5 MG/3ML) 0.083% IN NEBU
2.5000 mg | INHALATION_SOLUTION | Freq: Once | RESPIRATORY_TRACT | Status: AC
  Administered 2021-05-23: 2.5 mg via RESPIRATORY_TRACT

## 2021-07-02 DIAGNOSIS — R300 Dysuria: Secondary | ICD-10-CM | POA: Diagnosis not present

## 2021-07-19 ENCOUNTER — Other Ambulatory Visit (INDEPENDENT_AMBULATORY_CARE_PROVIDER_SITE_OTHER): Payer: Self-pay | Admitting: Gastroenterology

## 2021-07-19 DIAGNOSIS — K219 Gastro-esophageal reflux disease without esophagitis: Secondary | ICD-10-CM

## 2021-07-24 DIAGNOSIS — N644 Mastodynia: Secondary | ICD-10-CM | POA: Diagnosis not present

## 2021-07-24 DIAGNOSIS — F419 Anxiety disorder, unspecified: Secondary | ICD-10-CM | POA: Diagnosis not present

## 2021-07-24 DIAGNOSIS — F32A Depression, unspecified: Secondary | ICD-10-CM | POA: Diagnosis not present

## 2021-07-24 DIAGNOSIS — Z23 Encounter for immunization: Secondary | ICD-10-CM | POA: Diagnosis not present

## 2021-07-24 DIAGNOSIS — J329 Chronic sinusitis, unspecified: Secondary | ICD-10-CM | POA: Diagnosis not present

## 2021-07-25 ENCOUNTER — Other Ambulatory Visit: Payer: Self-pay

## 2021-07-25 ENCOUNTER — Ambulatory Visit: Payer: PPO | Admitting: Pulmonary Disease

## 2021-07-25 ENCOUNTER — Encounter: Payer: Self-pay | Admitting: Pulmonary Disease

## 2021-07-25 DIAGNOSIS — R0609 Other forms of dyspnea: Secondary | ICD-10-CM

## 2021-07-25 DIAGNOSIS — R0600 Dyspnea, unspecified: Secondary | ICD-10-CM | POA: Diagnosis not present

## 2021-07-25 NOTE — Assessment & Plan Note (Signed)
No etiology is obvious on today's evaluation.  PFTs recently performed and not showing evidence of airway obstruction or restriction.  She does not have any evidence of ILD on imaging or exam or PFTs.  Cardiac work-up including Zio patch and echo has not shown any evidence of diastolic dysfunction, RVSP could not be estimated but there is no evidence of pulmonary hypertension on exam. Certainly, deconditioning especially related to the COVID pandemic could be a cause.  I have asked her to gradually increase her activity and perhaps use a pulse ox meter to self monitor. Depression and anxiety could also be a cause, her PCP is apparently helping her with this  On ambulation, she did not desaturate which in reinforces that primary lung pathology seems unlikely

## 2021-07-25 NOTE — Patient Instructions (Signed)
Ambulatory saturation. No cause of shortness of breath identified on your testing

## 2021-07-25 NOTE — Progress Notes (Signed)
Subjective:    Patient ID: Kayla Berg, female    DOB: 06-24-1946, 75 y.o.   MRN: [id number]  HPI  Chief Complaint  Patient presents with   Consult    Patient has shortness of breath with exertion that has been going on for years. Feels fine at rest. Productive cough with clear/brown sputum.   75 year old never smoker presents for evaluation of dyspnea on exertion which has been ongoing for more than 5 years.  She was evaluated in the pulmonary clinic by my partner Dr. Melvyn Novas in 2017, PFTs were normal, she had a negative cardiac work-up, chest x-ray did not show any evidence of ILD  PMH -Sjogren's disease since 2008, on Plaquenil with Dr. Amil Amen  -COVID infection 12/2020, she is vaccinated and boosted  She reports shortness of breath after walking 15 to 20 minutes, resting makes it feel better.  She finds it difficult to complete household work.  I have reviewed PCP notes and her prior work-up She had an ED visit 01/2021 for shortness of breath, chest x-ray 02/21/2021 showed minimal scarring left lower lobe. ABG was 7.4 1/42/57, BNP was 68. Echo 03/2021 showed grade 1 diastolic dysfunction, normal LVEF, RVSP could not be estimated. Zio patch was normal  On her PCP visit 04/2021 D-dimer was checked and albuterol MDI was given, she reports this is only provided minimal relief.  PFTs were obtained which I reviewed, these do not show any evidence of airway obstruction or significant restriction  Esophagram 11/2020 did not show any evidence of stricture.  She reports a history of esophageal dilatation by GI  Significant tests/ events reviewed PFTs 04/2021 ratio 84, FEV1 120%, FVC 110%, TLC 90%, DLCO 13.97/76%  CT chest/ cors 01/2016 clear lungs  Echo nl Jan 2016 - 08/07/2016  Walked RA x 3 laps @ 185 ft each stopped due to  End of study, slow pace, no sob or desat  With CXR no evidence ILD   - PFT's  09/21/2016   wnl including curvature  Past Medical History:  Diagnosis Date   Arthritis     GERD (gastroesophageal reflux disease)    Sjoegren syndrome    Past Surgical History:  Procedure Laterality Date   COLONOSCOPY N/A 01/11/2020   Procedure: COLONOSCOPY;  Surgeon: Rogene Houston, MD;  [facility];  Service: Endoscopy;  Laterality: N/A;   ESOPHAGOGASTRODUODENOSCOPY N/A 01/11/2020   Procedure: ESOPHAGOGASTRODUODENOSCOPY (EGD) WITH ESOPHAGEAL DILATION;  Surgeon: Rogene Houston, MD;  [facility];  Service: Endoscopy;  Laterality: N/A;  1:25   POLYPECTOMY  01/11/2020   Procedure: POLYPECTOMY;  Surgeon: Rogene Houston, MD;  [facility];  Service: Endoscopy;;  hepatic flexure,transverse colon   TOTAL ABDOMINAL HYSTERECTOMY      Allergies  Allergen Reactions   Codeine Hives   Sulfa Antibiotics Hives    Social History   Socioeconomic History   Marital status: Widowed    Spouse name: Gwyndolyn Saxon   Number of children: 3   Years of education: 10th   Highest education level: Not on file  Occupational History   Occupation: retired  Tobacco Use   Smoking status: Never   Smokeless tobacco: Never  Vaping Use   Vaping Use: Never used  Substance and Sexual Activity   Alcohol use: Yes    Comment: moderately - wine   Drug use: No   Sexual activity: Not on file  Other Topics Concern   Not on file  Social History Narrative  Pt lives at home with spouse.    Caffeine Use: very little   Social Determinants of Radio broadcast assistant Strain: Not on file  Food Insecurity: Not on file  Transportation Needs: Not on file  Physical Activity: Not on file  Stress: Not on file  Social Connections: Not on file  Intimate Partner Violence: Not on file     Family History  Problem Relation Age of Onset   Heart disease Mother    Diabetes Mother    Heart disease Father        "hole in his heart"    Lung cancer Brother    Asthma Brother    Heart disease Sister    Stroke Sister       Review of Systems Shortness of breath with  activity Acid heartburn and indigestion Anxiety and depression Joint stiffness Dry eyes and mucosa Sadness of mood, related to ex-husband behavior     Constitutional: negative for anorexia, fevers and sweats  Eyes: negative for irritation, redness and visual disturbance  Ears, nose, mouth, throat, and face: negative for earaches, epistaxis, nasal congestion and sore throat  Respiratory: negative for cough, sputum and wheezing  Cardiovascular: negative for chest pain,  lower extremity edema, orthopnea, palpitations and syncope  Gastrointestinal: negative for abdominal pain, constipation, diarrhea, melena, nausea and vomiting  Genitourinary:negative for dysuria, frequency and hematuria  Hematologic/lymphatic: negative for bleeding, easy bruising and lymphadenopathy  Musculoskeletal:negative for arthralgias, muscle weakness  Neurological: negative for coordination problems, gait problems, headaches and weakness  Endocrine: negative for diabetic symptoms including polydipsia, polyuria and weight loss  Objective:   Physical Exam  Gen. Pleasant, elderly,well-nourished, in no distress, normal affect ENT - no pallor,icterus, no post nasal drip Neck: No JVD, no thyromegaly, no carotid bruits Lungs: no use of accessory muscles, no dullness to percussion, clear without rales or rhonchi  Cardiovascular: Rhythm regular, heart sounds  normal, no murmurs or gallops, no peripheral edema Abdomen: soft and non-tender, no hepatosplenomegaly, BS normal. Musculoskeletal: No deformities, no cyanosis or clubbing Neuro:  alert, non focal       Assessment & Plan:

## 2021-08-01 DIAGNOSIS — Z23 Encounter for immunization: Secondary | ICD-10-CM | POA: Diagnosis not present

## 2021-08-13 DIAGNOSIS — R922 Inconclusive mammogram: Secondary | ICD-10-CM | POA: Diagnosis not present

## 2021-08-13 DIAGNOSIS — N644 Mastodynia: Secondary | ICD-10-CM | POA: Diagnosis not present

## 2021-09-17 DIAGNOSIS — Z79899 Other long term (current) drug therapy: Secondary | ICD-10-CM | POA: Diagnosis not present

## 2021-09-17 DIAGNOSIS — M3500 Sicca syndrome, unspecified: Secondary | ICD-10-CM | POA: Diagnosis not present

## 2021-09-17 DIAGNOSIS — H40013 Open angle with borderline findings, low risk, bilateral: Secondary | ICD-10-CM | POA: Diagnosis not present

## 2021-09-17 DIAGNOSIS — H04123 Dry eye syndrome of bilateral lacrimal glands: Secondary | ICD-10-CM | POA: Diagnosis not present

## 2021-09-22 DIAGNOSIS — M2550 Pain in unspecified joint: Secondary | ICD-10-CM | POA: Diagnosis not present

## 2021-09-22 DIAGNOSIS — Z79899 Other long term (current) drug therapy: Secondary | ICD-10-CM | POA: Diagnosis not present

## 2021-09-22 DIAGNOSIS — M150 Primary generalized (osteo)arthritis: Secondary | ICD-10-CM | POA: Diagnosis not present

## 2021-09-22 DIAGNOSIS — M5136 Other intervertebral disc degeneration, lumbar region: Secondary | ICD-10-CM | POA: Diagnosis not present

## 2021-09-22 DIAGNOSIS — E663 Overweight: Secondary | ICD-10-CM | POA: Diagnosis not present

## 2021-09-22 DIAGNOSIS — M3500 Sicca syndrome, unspecified: Secondary | ICD-10-CM | POA: Diagnosis not present

## 2021-09-22 DIAGNOSIS — Z6827 Body mass index (BMI) 27.0-27.9, adult: Secondary | ICD-10-CM | POA: Diagnosis not present

## 2021-10-07 DIAGNOSIS — Z23 Encounter for immunization: Secondary | ICD-10-CM | POA: Diagnosis not present

## 2021-10-07 DIAGNOSIS — R350 Frequency of micturition: Secondary | ICD-10-CM | POA: Diagnosis not present

## 2021-10-13 ENCOUNTER — Other Ambulatory Visit (INDEPENDENT_AMBULATORY_CARE_PROVIDER_SITE_OTHER): Payer: Self-pay | Admitting: Internal Medicine

## 2021-10-13 DIAGNOSIS — K219 Gastro-esophageal reflux disease without esophagitis: Secondary | ICD-10-CM

## 2022-01-15 ENCOUNTER — Other Ambulatory Visit (HOSPITAL_COMMUNITY): Payer: Self-pay

## 2022-04-23 ENCOUNTER — Encounter (INDEPENDENT_AMBULATORY_CARE_PROVIDER_SITE_OTHER): Payer: Self-pay | Admitting: Gastroenterology

## 2022-04-23 ENCOUNTER — Other Ambulatory Visit (INDEPENDENT_AMBULATORY_CARE_PROVIDER_SITE_OTHER): Payer: Self-pay

## 2022-04-23 ENCOUNTER — Encounter (INDEPENDENT_AMBULATORY_CARE_PROVIDER_SITE_OTHER): Payer: Self-pay

## 2022-04-23 ENCOUNTER — Ambulatory Visit (INDEPENDENT_AMBULATORY_CARE_PROVIDER_SITE_OTHER): Payer: PPO | Admitting: Gastroenterology

## 2022-04-23 VITALS — BP 129/76 | HR 88 | Temp 97.6°F | Ht 64.0 in | Wt 158.2 lb

## 2022-04-23 DIAGNOSIS — K219 Gastro-esophageal reflux disease without esophagitis: Secondary | ICD-10-CM

## 2022-04-23 DIAGNOSIS — R109 Unspecified abdominal pain: Secondary | ICD-10-CM

## 2022-04-23 DIAGNOSIS — R6881 Early satiety: Secondary | ICD-10-CM | POA: Diagnosis not present

## 2022-04-23 NOTE — H&P (View-Only) (Signed)
Maylon Peppers, M.D. Gastroenterology & Hepatology [facility] [address] [place], [place] [address]  Primary Care Physician: Leeanne Rio, MD [place] [id number]  I will communicate my assessment and recommendations to the referring MD via EMR.  Problems: Early satiety/Upper abdominal discomfort GERD Esophageal web  History of Present Illness: Kayla Berg is a 76 y.o. female with past medical history of GERD, Sjogren's syndrome , esophageal web /p dilation who presents for evaluation of early satiety and abdominal pain, and follow up of GERD.  The patient was last seen on 04/22/21 by Dr. Laural Golden. At that time, the patient was continued on pantoprazole 40 mg in the morning and famotidine at night.  She was also given a prescription for Carafate 2 g every night.  She has noticed that for the last 6 months she has presented early satiety and feeling discomfort in her upper abdomen after eating. Denies any symptoms when she wakes up. She is eating less as she is feeling full easily. The patient denies having any nausea, vomiting, fever, chills, hematochezia, melena, hematemesis, abdominal distention, diarrhea, jaundice, pruritus. States she lost some weight after she had COVID, which decreased her appetite. Feels the appetite is improving and has been gaining back some of her previous weight.  She reports that she is having heartburn and acid regurgitation 5-6 times per month. She takes pantoprazole 40 mg qday and famotidine 20 mg at the same time in the afternoon. No dysphagia.  Last EGD: 12/2019 - Normal hypopharynx. - Web in the proximal esophagus. Dilated. - Z-line regular, 38 cm from the incisors. - 2 cm hiatal hernia. - Normal duodenal bulb and second portion of the duodenum. - No specimens collected.  Last Colonoscopy: 12/2019 - Two 5 mm polyps in the transverse colon and at the hepatic flexure, removed with  a cold snare. Resected and retrieved. - One small polyp in the distal transverse colon. Biopsied. - External and internal hemorrhoids.  Path: two TA and one lymph node  Repeat colonoscopy in 2026  Past Medical History: Past Medical History:  Diagnosis Date   Arthritis    GERD (gastroesophageal reflux disease)    Sjoegren syndrome     Past Surgical History: Past Surgical History:  Procedure Laterality Date   COLONOSCOPY N/A 01/11/2020   Procedure: COLONOSCOPY;  Surgeon: Rogene Houston, MD;  [facility];  Service: Endoscopy;  Laterality: N/A;   ESOPHAGOGASTRODUODENOSCOPY N/A 01/11/2020   Procedure: ESOPHAGOGASTRODUODENOSCOPY (EGD) WITH ESOPHAGEAL DILATION;  Surgeon: Rogene Houston, MD;  [facility];  Service: Endoscopy;  Laterality: N/A;  1:25   POLYPECTOMY  01/11/2020   Procedure: POLYPECTOMY;  Surgeon: Rogene Houston, MD;  [facility];  Service: Endoscopy;;  hepatic flexure,transverse colon   TOTAL ABDOMINAL HYSTERECTOMY      Family History: Family History  Problem Relation Age of Onset   Heart disease Mother    Diabetes Mother    Heart disease Father        "hole in his heart"    Lung cancer Brother    Asthma Brother    Heart disease Sister    Stroke Sister     Social History: Social History   Tobacco Use  Smoking Status Never  Smokeless Tobacco Never   Social History   Substance and Sexual Activity  Alcohol Use Yes   Comment: moderately - wine   Social History   Substance and Sexual  Activity  Drug Use No    Allergies: Allergies  Allergen Reactions   Codeine Hives   Sulfa Antibiotics Hives    Medications: Current Outpatient Medications  Medication Sig Dispense Refill   alendronate (FOSAMAX) 70 MG tablet Take 70 mg by mouth every Sunday. Take with a full glass of water on an empty stomach.     atorvastatin (LIPITOR) 10 MG tablet Take 10 mg by mouth every evening.      Biotin w/ Vitamins C & E (HAIR SKIN &  NAILS GUMMIES PO) Take 2 tablets by mouth daily.     Calcium Carbonate-Vitamin D ([employer]S CALCIUM CARBONATE/VIT D PO) Take 2 tablets by mouth daily.     chlordiazePOXIDE (LIBRIUM) 25 MG capsule Take 25 mg by mouth 3 (three) times daily as needed for anxiety.      famotidine (PEPCID) 20 MG tablet Take 20 mg by mouth at bedtime.     FLUoxetine (PROZAC) 40 MG capsule Take 40 mg by mouth daily.     hydroxychloroquine (PLAQUENIL) 200 MG tablet Take 200 mg by mouth 2 (two) times daily before a meal.      ibuprofen (ADVIL,MOTRIN) 200 MG tablet Take 200 mg by mouth every 6 (six) hours as needed for pain.     metoprolol succinate (TOPROL-XL) 25 MG 24 hr tablet Take 12.5 mg by mouth every other day. In the evening.     Multiple Vitamins-Minerals (ADULT ONE DAILY GUMMIES PO) Take 2 tablets by mouth daily.     Multiple Vitamins-Minerals (BITOIN PLUS/CALCIUM/VIT D3) TABS Take 2 tablets by mouth daily. Beautiful Hair with Biotin 5000 mcg     pantoprazole (PROTONIX) 40 MG tablet Take 1 tablet by mouth once daily 90 tablet 1   Polyethyl Glycol-Propyl Glycol (LUBRICANT EYE DROPS) 0.4-0.3 % SOLN Place 1 drop into both eyes 3 (three) times daily as needed (dry/irritated eyes.).     triamcinolone cream (KENALOG) 0.1 % Apply 1 application topically daily as needed (eczema (affected area(s) of feet)).      valACYclovir (VALTREX) 500 MG tablet Take 500 mg by mouth daily.     sucralfate (CARAFATE) 1 g tablet Take 2 tablets (2 g total) by mouth at bedtime. (Patient not taking: Reported on 04/23/2022) 60 tablet 5   No current facility-administered medications for this visit.    Review of Systems: GENERAL: negative for malaise, night sweats HEENT: No changes in hearing or vision, no nose bleeds or other nasal problems. NECK: Negative for lumps, goiter, pain and significant neck swelling RESPIRATORY: Negative for cough, wheezing CARDIOVASCULAR: Negative for chest pain, leg swelling, palpitations, orthopnea GI: SEE  HPI MUSCULOSKELETAL: Negative for joint pain or swelling, back pain, and muscle pain. SKIN: Negative for lesions, rash PSYCH: Negative for sleep disturbance, mood disorder and recent psychosocial stressors. HEMATOLOGY Negative for prolonged bleeding, bruising easily, and swollen nodes. ENDOCRINE: Negative for cold or heat intolerance, polyuria, polydipsia and goiter. NEURO: negative for tremor, gait imbalance, syncope and seizures. The remainder of the review of systems is noncontributory.   Physical Exam: BP 129/76 (BP Location: Left Arm, Patient Position: Sitting, Cuff Size: Small)   Pulse 88   Temp 97.6 F (36.4 C) (Oral)   Ht '5\' 4"'$  (1.626 m)   Wt 158 lb 3.2 oz (71.8 kg)   BMI 27.15 kg/m  GENERAL: The patient is AO x3, in no acute distress. HEENT: Head is normocephalic and atraumatic. EOMI are intact. Mouth is well hydrated and without lesions. NECK: Supple. No masses LUNGS: Clear to auscultation.  No presence of rhonchi/wheezing/rales. Adequate chest expansion HEART: RRR, normal s1 and s2. ABDOMEN: Soft, nontender, no guarding, no peritoneal signs, and nondistended. BS +. No masses. EXTREMITIES: Without any cyanosis, clubbing, rash, lesions or edema. NEUROLOGIC: AOx3, no focal motor deficit. SKIN: no jaundice, no rashes  Imaging/Labs: as above  I personally reviewed and interpreted the available labs, imaging and endoscopic files.  Impression and Plan: Kayla Berg is a 76 y.o. female with past medical history of GERD, Sjogren's syndrome , esophageal web /p dilation who presents for evaluation of early satiety and abdominal pain, and follow up of GERD.  The patient has been doing relatively well on her current dose of pantoprazole and Pepcid, although she is still presenting some breakthrough episodes.  I had a discussion with the patient regarding the adequate way to increase its efficacy.  She understood and agreed to take the medication as indicated.  She has presented new  onset early satiety with some weight loss.  We will evaluate this further with an EGD.  If unremarkable, we we will proceed with a gastric emptying study.  - Schedule EGD - If normal EGD, will proceed with gastric emptying study - Continue pantoprazole 40 mg in AM and Pepcid 20 mg in PM - Explained presumed etiology of reflux symptoms. Instruction provided in the use of antireflux medication - patient should take medication in the morning 30-45 minutes before eating breakfast and dinner. Discussed avoidance of eating within 2 hours of lying down to sleep and benefit of blocks to elevate head of bed. Also, will benefit from avoiding carbonated drinks/sodas or food that has tomatoes, spicy or greasy food.  All questions were answered.      Harvel Quale, MD Gastroenterology and Hepatology [facility]

## 2022-04-23 NOTE — Progress Notes (Signed)
Maylon Peppers, M.D. ?Gastroenterology & Hepatology ?[facility] ?[address] ?[place], [place] [address] ? ?Primary Care Physician: ?Leeanne Rio, MD ?[address] ?[place] [address] ? ?I will communicate my assessment and recommendations to the referring MD via EMR. ? ?Problems: ?Early satiety/Upper abdominal discomfort ?GERD ?Esophageal web ? ?History of Present Illness: ?Kayla Berg is a 76 y.o. female with past medical history of GERD, Sjogren's syndrome , esophageal web /p dilation who presents for evaluation of early satiety and abdominal pain, and follow up of GERD. ? ?The patient was last seen on 04/22/21 by Dr. Laural Golden. At that time, the patient was continued on pantoprazole 40 mg in the morning and famotidine at night.  She was also given a prescription for Carafate 2 g every night. ? ?She has noticed that for the last 6 months she has presented early satiety and feeling discomfort in her upper abdomen after eating. Denies any symptoms when she wakes up. She is eating less as she is feeling full easily. The patient denies having any nausea, vomiting, fever, chills, hematochezia, melena, hematemesis, abdominal distention, diarrhea, jaundice, pruritus. States she lost some weight after she had COVID, which decreased her appetite. Feels the appetite is improving and has been gaining back some of her previous weight. ? ?She reports that she is having heartburn and acid regurgitation 5-6 times per month. She takes pantoprazole 40 mg qday and famotidine 20 mg at the same time in the afternoon. No dysphagia. ? ?Last EGD: 12/2019 ?- Normal hypopharynx. ?- Web in the proximal esophagus. Dilated. ?- Z-line regular, 38 cm from the incisors. ?- 2 cm hiatal hernia. ?- Normal duodenal bulb and second portion of the duodenum. ?- No specimens collected. ? ?Last Colonoscopy: 12/2019 ?- Two 5 mm polyps in the transverse colon and at the hepatic flexure, removed with  a cold ?snare. Resected and retrieved. ?- One small polyp in the distal transverse colon. Biopsied. ?- External and internal hemorrhoids. ? ?Path: two TA and one lymph node ? ?Repeat colonoscopy in 2026 ? ?Past Medical History: ?Past Medical History:  ?Diagnosis Date  ? Arthritis   ? GERD (gastroesophageal reflux disease)   ? Sjoegren syndrome   ? ? ?Past Surgical History: ?Past Surgical History:  ?Procedure Laterality Date  ? COLONOSCOPY N/A 01/11/2020  ? Procedure: COLONOSCOPY;  Surgeon: Rogene Houston, MD;  [facility];  Service: Endoscopy;  Laterality: N/A;  ? ESOPHAGOGASTRODUODENOSCOPY N/A 01/11/2020  ? Procedure: ESOPHAGOGASTRODUODENOSCOPY (EGD) WITH ESOPHAGEAL DILATION;  Surgeon: Rogene Houston, MD;  [facility];  Service: Endoscopy;  Laterality: N/A;  1:25  ? POLYPECTOMY  01/11/2020  ? Procedure: POLYPECTOMY;  Surgeon: Rogene Houston, MD;  [facility];  Service: Endoscopy;;  hepatic flexure,transverse colon  ? TOTAL ABDOMINAL HYSTERECTOMY    ? ? ?Family History: ?Family History  ?Problem Relation Age of Onset  ? Heart disease Mother   ? Diabetes Mother   ? Heart disease Father   ?     "hole in his heart"   ? Lung cancer Brother   ? Asthma Brother   ? Heart disease Sister   ? Stroke Sister   ? ? ?Social History: ?Social History  ? ?Tobacco Use  ?Smoking Status Never  ?Smokeless Tobacco Never  ? ?Social History  ? ?Substance and Sexual Activity  ?Alcohol Use Yes  ? Comment: moderately - wine  ? ?Social History  ? ?Substance and Sexual  Activity  ?Drug Use No  ? ? ?Allergies: ?Allergies  ?Allergen Reactions  ? Codeine Hives  ? Sulfa Antibiotics Hives  ? ? ?Medications: ?Current Outpatient Medications  ?Medication Sig Dispense Refill  ? alendronate (FOSAMAX) 70 MG tablet Take 70 mg by mouth every Sunday. Take with a full glass of water on an empty stomach.    ? atorvastatin (LIPITOR) 10 MG tablet Take 10 mg by mouth every evening.     ? Biotin w/ Vitamins C & E (HAIR SKIN &  NAILS GUMMIES PO) Take 2 tablets by mouth daily.    ? Calcium Carbonate-Vitamin D (CVS CALCIUM CARBONATE/VIT D PO) Take 2 tablets by mouth daily.    ? chlordiazePOXIDE (LIBRIUM) 25 MG capsule Take 25 mg by mouth 3 (three) times daily as needed for anxiety.     ? famotidine (PEPCID) 20 MG tablet Take 20 mg by mouth at bedtime.    ? FLUoxetine (PROZAC) 40 MG capsule Take 40 mg by mouth daily.    ? hydroxychloroquine (PLAQUENIL) 200 MG tablet Take 200 mg by mouth 2 (two) times daily before a meal.     ? ibuprofen (ADVIL,MOTRIN) 200 MG tablet Take 200 mg by mouth every 6 (six) hours as needed for pain.    ? metoprolol succinate (TOPROL-XL) 25 MG 24 hr tablet Take 12.5 mg by mouth every other day. In the evening.    ? Multiple Vitamins-Minerals (ADULT ONE DAILY GUMMIES PO) Take 2 tablets by mouth daily.    ? Multiple Vitamins-Minerals (BITOIN PLUS/CALCIUM/VIT D3) TABS Take 2 tablets by mouth daily. Beautiful Hair with Biotin 5000 mcg    ? pantoprazole (PROTONIX) 40 MG tablet Take 1 tablet by mouth once daily 90 tablet 1  ? Polyethyl Glycol-Propyl Glycol (LUBRICANT EYE DROPS) 0.4-0.3 % SOLN Place 1 drop into both eyes 3 (three) times daily as needed (dry/irritated eyes.).    ? triamcinolone cream (KENALOG) 0.1 % Apply 1 application topically daily as needed (eczema (affected area(s) of feet)).     ? valACYclovir (VALTREX) 500 MG tablet Take 500 mg by mouth daily.    ? sucralfate (CARAFATE) 1 g tablet Take 2 tablets (2 g total) by mouth at bedtime. (Patient not taking: Reported on 04/23/2022) 60 tablet 5  ? ?No current facility-administered medications for this visit.  ? ? ?Review of Systems: ?GENERAL: negative for malaise, night sweats ?HEENT: No changes in hearing or vision, no nose bleeds or other nasal problems. ?NECK: Negative for lumps, goiter, pain and significant neck swelling ?RESPIRATORY: Negative for cough, wheezing ?CARDIOVASCULAR: Negative for chest pain, leg swelling, palpitations, orthopnea ?GI: SEE  HPI ?MUSCULOSKELETAL: Negative for joint pain or swelling, back pain, and muscle pain. ?SKIN: Negative for lesions, rash ?PSYCH: Negative for sleep disturbance, mood disorder and recent psychosocial stressors. ?HEMATOLOGY Negative for prolonged bleeding, bruising easily, and swollen nodes. ?ENDOCRINE: Negative for cold or heat intolerance, polyuria, polydipsia and goiter. ?NEURO: negative for tremor, gait imbalance, syncope and seizures. ?The remainder of the review of systems is noncontributory. ? ? ?Physical Exam: ?BP 129/76 (BP Location: Left Arm, Patient Position: Sitting, Cuff Size: Small)   Pulse 88   Temp 97.6 ?F (36.4 ?C) (Oral)   Ht '5\' 4"'$  (1.626 m)   Wt 158 lb 3.2 oz (71.8 kg)   BMI 27.15 kg/m?  ?GENERAL: The patient is AO x3, in no acute distress. ?HEENT: Head is normocephalic and atraumatic. EOMI are intact. Mouth is well hydrated and without lesions. ?NECK: Supple. No masses ?LUNGS: Clear to auscultation.  No presence of rhonchi/wheezing/rales. Adequate chest expansion ?HEART: RRR, normal s1 and s2. ?ABDOMEN: Soft, nontender, no guarding, no peritoneal signs, and nondistended. BS +. No masses. ?EXTREMITIES: Without any cyanosis, clubbing, rash, lesions or edema. ?NEUROLOGIC: AOx3, no focal motor deficit. ?SKIN: no jaundice, no rashes ? ?Imaging/Labs: ?as above ? ?I personally reviewed and interpreted the available labs, imaging and endoscopic files. ? ?Impression and Plan: ?Kayla Berg is a 76 y.o. female with past medical history of GERD, Sjogren's syndrome , esophageal web /p dilation who presents for evaluation of early satiety and abdominal pain, and follow up of GERD.  The patient has been doing relatively well on her current dose of pantoprazole and Pepcid, although she is still presenting some breakthrough episodes.  I had a discussion with the patient regarding the adequate way to increase its efficacy.  She understood and agreed to take the medication as indicated. ? ?She has presented new  onset early satiety with some weight loss.  We will evaluate this further with an EGD.  If unremarkable, we we will proceed with a gastric emptying study. ? ?- Schedule EGD ?- If normal EGD, will proceed with Laray Anger

## 2022-04-23 NOTE — Patient Instructions (Addendum)
Schedule EGD ?If normal EGD, will proceed with gastric emptying study ?Continue pantoprazole 40 mg in AM and Pepcid 20 mg in PM ?Explained presumed etiology of reflux symptoms. Instruction provided in the use of antireflux medication - patient should take medication in the morning 30-45 minutes before eating breakfast and dinner. Discussed avoidance of eating within 2 hours of lying down to sleep and benefit of blocks to elevate head of bed. Also, will benefit from avoiding carbonated drinks/sodas or food that has tomatoes, spicy or greasy food. ? ?

## 2022-05-19 ENCOUNTER — Other Ambulatory Visit (INDEPENDENT_AMBULATORY_CARE_PROVIDER_SITE_OTHER): Payer: Self-pay | Admitting: Internal Medicine

## 2022-05-19 DIAGNOSIS — K219 Gastro-esophageal reflux disease without esophagitis: Secondary | ICD-10-CM

## 2022-05-22 ENCOUNTER — Encounter (HOSPITAL_COMMUNITY)
Admission: RE | Disposition: A | Discharge status: Home / Self Care | Payer: Self-pay | Source: Home / Self Care | Attending: Gastroenterology

## 2022-05-22 ENCOUNTER — Ambulatory Visit (HOSPITAL_BASED_OUTPATIENT_CLINIC_OR_DEPARTMENT_OTHER): Payer: PPO | Admitting: Anesthesiology

## 2022-05-22 ENCOUNTER — Encounter (HOSPITAL_COMMUNITY): Payer: Self-pay | Admitting: Gastroenterology

## 2022-05-22 ENCOUNTER — Ambulatory Visit (HOSPITAL_COMMUNITY): Payer: PPO | Admitting: Anesthesiology

## 2022-05-22 ENCOUNTER — Other Ambulatory Visit: Payer: Self-pay

## 2022-05-22 ENCOUNTER — Ambulatory Visit (HOSPITAL_COMMUNITY)
Admission: RE | Admit: 2022-05-22 | Discharge: 2022-05-22 | Disposition: A | Discharge status: Home / Self Care | Payer: PPO | Attending: Gastroenterology | Admitting: Gastroenterology

## 2022-05-22 DIAGNOSIS — R6881 Early satiety: Secondary | ICD-10-CM | POA: Diagnosis present

## 2022-05-22 DIAGNOSIS — K219 Gastro-esophageal reflux disease without esophagitis: Secondary | ICD-10-CM | POA: Diagnosis not present

## 2022-05-22 DIAGNOSIS — R0609 Other forms of dyspnea: Secondary | ICD-10-CM

## 2022-05-22 DIAGNOSIS — M1990 Unspecified osteoarthritis, unspecified site: Secondary | ICD-10-CM | POA: Diagnosis not present

## 2022-05-22 DIAGNOSIS — Z79899 Other long term (current) drug therapy: Secondary | ICD-10-CM | POA: Diagnosis not present

## 2022-05-22 DIAGNOSIS — F419 Anxiety disorder, unspecified: Secondary | ICD-10-CM | POA: Insufficient documentation

## 2022-05-22 DIAGNOSIS — M3500 Sicca syndrome, unspecified: Secondary | ICD-10-CM | POA: Diagnosis not present

## 2022-05-22 DIAGNOSIS — Q394 Esophageal web: Secondary | ICD-10-CM | POA: Diagnosis not present

## 2022-05-22 DIAGNOSIS — R109 Unspecified abdominal pain: Secondary | ICD-10-CM | POA: Diagnosis not present

## 2022-05-22 DIAGNOSIS — M179 Osteoarthritis of knee, unspecified: Secondary | ICD-10-CM

## 2022-05-22 DIAGNOSIS — Z86010 Personal history of colonic polyps: Secondary | ICD-10-CM | POA: Diagnosis not present

## 2022-05-22 DIAGNOSIS — F32A Depression, unspecified: Secondary | ICD-10-CM | POA: Insufficient documentation

## 2022-05-22 DIAGNOSIS — R12 Heartburn: Secondary | ICD-10-CM | POA: Diagnosis not present

## 2022-05-22 DIAGNOSIS — Z1211 Encounter for screening for malignant neoplasm of colon: Secondary | ICD-10-CM

## 2022-05-22 DIAGNOSIS — K449 Diaphragmatic hernia without obstruction or gangrene: Secondary | ICD-10-CM | POA: Diagnosis not present

## 2022-05-22 DIAGNOSIS — R058 Other specified cough: Secondary | ICD-10-CM

## 2022-05-22 DIAGNOSIS — R079 Chest pain, unspecified: Secondary | ICD-10-CM

## 2022-05-22 DIAGNOSIS — R1314 Dysphagia, pharyngoesophageal phase: Secondary | ICD-10-CM

## 2022-05-22 HISTORY — PX: ESOPHAGOGASTRODUODENOSCOPY (EGD) WITH PROPOFOL: SHX5813

## 2022-05-22 HISTORY — DX: Depression, unspecified: F32.A

## 2022-05-22 HISTORY — DX: Anxiety disorder, unspecified: F41.9

## 2022-05-22 SURGERY — ESOPHAGOGASTRODUODENOSCOPY (EGD) WITH PROPOFOL
Anesthesia: General

## 2022-05-22 MED ORDER — LACTATED RINGERS IV SOLN
INTRAVENOUS | Status: DC
  Administered 2022-05-22: 1000 mL via INTRAVENOUS

## 2022-05-22 MED ORDER — PROPOFOL 10 MG/ML IV BOLUS
INTRAVENOUS | Status: DC | PRN
  Administered 2022-05-22: 60 mg via INTRAVENOUS
  Administered 2022-05-22: 40 mg via INTRAVENOUS

## 2022-05-22 MED ORDER — LIDOCAINE HCL (CARDIAC) PF 100 MG/5ML IV SOSY
PREFILLED_SYRINGE | INTRAVENOUS | Status: DC | PRN
  Administered 2022-05-22: 100 mg via INTRATRACHEAL

## 2022-05-22 NOTE — Anesthesia Postprocedure Evaluation (Signed)
Anesthesia Post Note  Patient: Kayla Berg  Procedure(s) Performed: ESOPHAGOGASTRODUODENOSCOPY (EGD) WITH PROPOFOL  Patient location during evaluation: Phase II Anesthesia Type: General Level of consciousness: awake and alert and oriented Pain management: pain level controlled Vital Signs Assessment: post-procedure vital signs reviewed and stable Respiratory status: spontaneous breathing, nonlabored ventilation and respiratory function stable Cardiovascular status: blood pressure returned to baseline and stable Postop Assessment: no apparent nausea or vomiting Anesthetic complications: no   No notable events documented.   Last Vitals:  Vitals:   05/22/22 1123 05/22/22 1155  BP: 122/66 (!) 97/49  Pulse: 84 83  Resp: 14 18  Temp: 36.7 C 36.6 C  SpO2: 99% 96%    Last Pain:  Vitals:   05/22/22 1155  TempSrc: Oral  PainSc: 0-No pain                 Jazleen Robeck C Winnell Bento

## 2022-05-22 NOTE — Discharge Instructions (Signed)
You are being discharged to home.  Resume your previous diet.  Schedule gastric emptying study.

## 2022-05-22 NOTE — Transfer of Care (Signed)
Immediate Anesthesia Transfer of Care Note  Patient: Kayla Berg  Procedure(s) Performed: ESOPHAGOGASTRODUODENOSCOPY (EGD) WITH PROPOFOL  Patient Location: Short Stay  Anesthesia Type:General  Level of Consciousness: sedated  Airway & Oxygen Therapy: Patient Spontanous Breathing  Post-op Assessment: Report given to RN and Post -op Vital signs reviewed and stable  Post vital signs: Reviewed and stable  Last Vitals:  Vitals Value Taken Time  BP    Temp    Pulse    Resp    SpO2      Last Pain:  Vitals:   05/22/22 1123  TempSrc: Oral  PainSc: 0-No pain      Patients Stated Pain Goal: 7 (06/34/94 9447)  Complications: No notable events documented.

## 2022-05-22 NOTE — Op Note (Signed)
[facility] Patient Name: Kayla Berg Procedure Date: 05/22/2022 11:32 AM MRN: [id number] Date of Birth: 12-Mar-1946 Attending MD: Maylon Peppers ,  CSN: [id number] Age: 76 Admit Type: Outpatient Procedure:                Upper GI endoscopy Indications:              Early satiety Providers:                Maylon Peppers, Janeece Riggers, RN, Gwynneth Albright RN, RN Referring MD:              Medicines:                Monitored Anesthesia Care Complications:            No immediate complications. Estimated Blood Loss:     Estimated blood loss: none. Procedure:                Pre-Anesthesia Assessment:                           - Prior to the procedure, a History and Physical                            was performed, and patient medications, allergies                            and sensitivities were reviewed. The patient's                            tolerance of previous anesthesia was reviewed.                           - The risks and benefits of the procedure and the                            sedation options and risks were discussed with the                            patient. All questions were answered and informed                            consent was obtained.                           - ASA Grade Assessment: II - A patient with mild                            systemic disease.                           After obtaining informed consent, the endoscope was                            passed under direct vision. Throughout the  procedure, the patient's blood pressure, pulse, and                            oxygen saturations were monitored continuously. The                            [id number]-[id number] ([id number]) scope was introduced through the                            mouth, and advanced to the second part of duodenum.                            The upper GI endoscopy was accomplished without                             difficulty. The patient tolerated the procedure                            well. Scope In: 11:47:45 AM Scope Out: 11:51:51 AM Total Procedure Duration: 0 hours 4 minutes 6 seconds  Findings:      The esophagus was normal.      The stomach was normal.      The examined duodenum was normal. Impression:               - Normal esophagus.                           - Normal stomach.                           - Normal examined duodenum.                           - No specimens collected. Moderate Sedation:      Per Anesthesia Care Recommendation:           - Discharge patient to home (ambulatory).                           - Resume previous diet.                           - Schedule gastric emptying study. Procedure Code(s):        --- Professional ---                           [id number], Esophagogastroduodenoscopy, flexible,                            transoral; diagnostic, including collection of                            specimen(s) by brushing or washing, when performed                            (separate procedure) Diagnosis Code(s):        --- Professional ---  R68.81, Early satiety CPT copyright 2019 American Medical Association. All rights reserved. The codes documented in this report are preliminary and upon coder review may  be revised to meet current compliance requirements. Maylon Peppers, MD Maylon Peppers,  05/22/2022 11:55:46 AM This report has been signed electronically. Number of Addenda: 0

## 2022-05-22 NOTE — Interval H&P Note (Signed)
History and Physical Interval Note:  05/22/2022 11:08 AM  Kayla Berg  has presented today for surgery, with the diagnosis of Abdominal Discomfort Early Satiety.  The various methods of treatment have been discussed with the patient and family. After consideration of risks, benefits and other options for treatment, the patient has consented to  Procedure(s) with comments: ESOPHAGOGASTRODUODENOSCOPY (EGD) WITH PROPOFOL (N/A) - 120 as a surgical intervention.  The patient's history has been reviewed, patient examined, no change in status, stable for surgery.  I have reviewed the patient's chart and labs.  Questions were answered to the patient's satisfaction.     Maylon Peppers Mayorga

## 2022-05-22 NOTE — Anesthesia Preprocedure Evaluation (Addendum)
Anesthesia Evaluation  Patient identified by MRN, date of birth, ID band Patient awake    Reviewed: Allergy & Precautions, NPO status , Patient's Chart, lab work & pertinent test results  Airway Mallampati: II  TM Distance: >3 FB Neck ROM: Full    Dental  (+) Dental Advisory Given, Caps   Pulmonary neg pulmonary ROS,    Pulmonary exam normal breath sounds clear to auscultation       Cardiovascular Exercise Tolerance: Good + DOE  Normal cardiovascular exam Rhythm:Regular Rate:Normal     Neuro/Psych PSYCHIATRIC DISORDERS Anxiety Depression negative neurological ROS     GI/Hepatic GERD  Medicated and Controlled,(+)     substance abuse  alcohol use,   Endo/Other  negative endocrine ROS  Renal/GU negative Renal ROS  negative genitourinary   Musculoskeletal  (+) Arthritis ,   Abdominal   Peds negative pediatric ROS (+)  Hematology negative hematology ROS (+)   Anesthesia Other Findings Sjogren's syndrome  Reproductive/Obstetrics negative OB ROS                            Anesthesia Physical Anesthesia Plan  ASA: 2  Anesthesia Plan: General   Post-op Pain Management: Minimal or no pain anticipated   Induction: Intravenous  PONV Risk Score and Plan: Propofol infusion  Airway Management Planned: Nasal Cannula and Natural Airway  Additional Equipment:   Intra-op Plan:   Post-operative Plan:   Informed Consent: I have reviewed the patients History and Physical, chart, labs and discussed the procedure including the risks, benefits and alternatives for the proposed anesthesia with the patient or authorized representative who has indicated his/her understanding and acceptance.     Dental advisory given  Plan Discussed with: CRNA and Surgeon  Anesthesia Plan Comments:         Anesthesia Quick Evaluation

## 2022-05-26 ENCOUNTER — Other Ambulatory Visit (INDEPENDENT_AMBULATORY_CARE_PROVIDER_SITE_OTHER): Payer: Self-pay

## 2022-05-26 ENCOUNTER — Telehealth (INDEPENDENT_AMBULATORY_CARE_PROVIDER_SITE_OTHER): Payer: Self-pay

## 2022-05-26 DIAGNOSIS — R6881 Early satiety: Secondary | ICD-10-CM

## 2022-05-26 NOTE — Telephone Encounter (Signed)
GES is scheduled on Friday 05/29/22 at 9:00 Kayla Berg is aware

## 2022-05-26 NOTE — Telephone Encounter (Signed)
Thanks

## 2022-05-28 ENCOUNTER — Emergency Department (HOSPITAL_COMMUNITY)
Admission: EM | Admit: 2022-05-28 | Discharge: 2022-05-29 | Disposition: A | Discharge status: Home / Self Care | Payer: PPO | Attending: Emergency Medicine | Admitting: Emergency Medicine

## 2022-05-28 ENCOUNTER — Other Ambulatory Visit: Payer: Self-pay

## 2022-05-28 ENCOUNTER — Encounter (HOSPITAL_COMMUNITY): Payer: Self-pay | Admitting: Gastroenterology

## 2022-05-28 DIAGNOSIS — L989 Disorder of the skin and subcutaneous tissue, unspecified: Secondary | ICD-10-CM | POA: Insufficient documentation

## 2022-05-28 NOTE — ED Triage Notes (Signed)
Pt states she has a knot on the back of her head that is sore and "growing". States has been going on x 2 weeks.

## 2022-05-29 ENCOUNTER — Ambulatory Visit (HOSPITAL_COMMUNITY)
Admission: RE | Admit: 2022-05-29 | Discharge: 2022-05-29 | Disposition: A | Discharge status: Home / Self Care | Payer: PPO | Source: Ambulatory Visit | Attending: Gastroenterology | Admitting: Gastroenterology

## 2022-05-29 DIAGNOSIS — R6881 Early satiety: Secondary | ICD-10-CM | POA: Insufficient documentation

## 2022-05-29 MED ORDER — TECHNETIUM TC 99M SULFUR COLLOID
2.0000 | Freq: Once | INTRAVENOUS | Status: AC | PRN
  Administered 2022-05-29: 2.1 via ORAL

## 2022-05-29 NOTE — ED Provider Notes (Signed)
[facility] EMERGENCY DEPARTMENT Provider Note   CSN: [id number] Arrival date & time: 05/28/22  2330     History  Chief Complaint  Patient presents with   Cyst    Kayla Berg is a 76 y.o. female.  The history is provided by the patient.  Patient reports she has a "knot" on the back of her head.  She reports is been ongoing for several weeks but she feels it is getting larger.  She now reports it is sore No recent trauma.  No fevers or chills.    Home Medications Prior to Admission medications   Medication Sig Start Date End Date Taking? Authorizing Provider  albuterol (VENTOLIN HFA) 108 (90 Base) MCG/ACT inhaler Inhale 2 puffs into the lungs every 6 (six) hours as needed for shortness of breath. 03/21/22   [provider]  alendronate (FOSAMAX) 70 MG tablet Take 70 mg by mouth every Sunday. Take with a full glass of water on an empty stomach.    [provider]  atorvastatin (LIPITOR) 10 MG tablet Take 10 mg by mouth every evening.     [provider]  Biotin w/ Vitamins C & E (HAIR SKIN & NAILS GUMMIES PO) Take 2 tablets by mouth daily.    [provider]  Calcium Carbonate-Vitamin D ([employer] CALCIUM CARBONATE/VIT D PO) Take 2 tablets by mouth daily.    [provider]  chlordiazePOXIDE (LIBRIUM) 25 MG capsule Take 25 mg by mouth 3 (three) times daily as needed for anxiety.     [provider]  famotidine (PEPCID) 20 MG tablet Take 20 mg by mouth at bedtime.    [provider]  FLUoxetine (PROZAC) 40 MG capsule Take 40 mg by mouth daily.    [provider]  hydroxychloroquine (PLAQUENIL) 200 MG tablet Take 200 mg by mouth 2 (two) times daily before a meal.     [provider]  ibuprofen (ADVIL,MOTRIN) 200 MG tablet Take 200 mg by mouth every 6 (six) hours as needed for pain.    [provider]  loratadine (CLARITIN) 10 MG tablet Take 10 mg by mouth daily as needed for allergies.    [provider]  metoprolol succinate (TOPROL-XL) 25 MG 24 hr tablet Take 12.5 mg by mouth every other day. In the evening. 03/12/16   [provider]  Multiple Vitamins-Minerals (ADULT ONE DAILY GUMMIES PO) Take 2 tablets by mouth daily.    [provider]  pantoprazole (PROTONIX) 40 MG tablet Take 1 tablet by mouth once daily 05/19/22   Montez Morita, Quillian Quince, MD  Polyethyl Glycol-Propyl Glycol (LUBRICANT EYE DROPS) 0.4-0.3 % SOLN Place 1 drop into both eyes 3 (three) times daily as needed (dry/irritated eyes.).    [provider]  triamcinolone cream (KENALOG) 0.1 % Apply 1 application topically daily as needed (eczema (affected area(s) of feet)).     [provider]  valACYclovir (VALTREX) 500 MG tablet Take 500 mg by mouth daily. 03/05/13   [provider]      Allergies    Codeine and Sulfa antibiotics    Review of Systems   Review of Systems  Constitutional:  Negative for fever.   Physical Exam Updated Vital Signs BP 119/71 (BP Location: Left Arm)   Pulse 78   Temp 97.9 F (36.6 C) (Oral)   Resp 18   Ht 1.613 m (5' 3.5")   Wt 71.7 kg   SpO2 97%   BMI 27.55 kg/m  Physical Exam  CONSTITUTIONAL: Well developed/well nourished HEAD: Normocephalic/atraumatic Mild tenderness noted to posterior scalp.  Questionable swelling noted to the occiput.  No erythema.  No abscess.  No bogginess.  No signs of trauma.  No bruising.  No crepitus EYES: EOMI/PERRL ENMT: Mucous membranes moist NECK: supple no meningeal signs NEURO: Pt is awake/alert/appropriate, moves all extremitiesx4.   SKIN: warm, color normal PSYCH: no abnormalities of mood noted, alert and oriented to situation  ED Results / Procedures / Treatments   Labs (all labs ordered are listed, but only abnormal results are displayed) Labs Reviewed - No data to display  EKG None  Radiology No results found.  Procedures Procedures    Medications Ordered in ED Medications - No  data to display  ED Course/ Medical Decision Making/ A&P                           Medical Decision Making  No signs of any infectious etiology We will refer to dermatology        Final Clinical Impression(s) / ED Diagnoses Final diagnoses:  Skin lesion    Rx / DC Orders ED Discharge Orders     None         Ripley Fraise, MD 05/29/22 [phone]

## 2022-11-26 ENCOUNTER — Other Ambulatory Visit (INDEPENDENT_AMBULATORY_CARE_PROVIDER_SITE_OTHER): Payer: Self-pay | Admitting: Gastroenterology

## 2022-11-26 DIAGNOSIS — K219 Gastro-esophageal reflux disease without esophagitis: Secondary | ICD-10-CM

## 2023-04-13 ENCOUNTER — Other Ambulatory Visit (INDEPENDENT_AMBULATORY_CARE_PROVIDER_SITE_OTHER): Payer: Self-pay | Admitting: Gastroenterology

## 2023-04-13 DIAGNOSIS — K219 Gastro-esophageal reflux disease without esophagitis: Secondary | ICD-10-CM

## 2023-04-26 ENCOUNTER — Encounter (INDEPENDENT_AMBULATORY_CARE_PROVIDER_SITE_OTHER): Payer: Self-pay | Admitting: Gastroenterology

## 2023-04-26 ENCOUNTER — Ambulatory Visit (INDEPENDENT_AMBULATORY_CARE_PROVIDER_SITE_OTHER): Payer: PPO | Admitting: Gastroenterology

## 2023-04-26 VITALS — BP 123/74 | HR 102 | Temp 97.1°F | Ht 64.0 in | Wt 157.5 lb

## 2023-04-26 DIAGNOSIS — R1314 Dysphagia, pharyngoesophageal phase: Secondary | ICD-10-CM

## 2023-04-26 DIAGNOSIS — K5904 Chronic idiopathic constipation: Secondary | ICD-10-CM | POA: Diagnosis not present

## 2023-04-26 DIAGNOSIS — K3184 Gastroparesis: Secondary | ICD-10-CM

## 2023-04-26 DIAGNOSIS — K219 Gastro-esophageal reflux disease without esophagitis: Secondary | ICD-10-CM

## 2023-04-26 DIAGNOSIS — K5900 Constipation, unspecified: Secondary | ICD-10-CM | POA: Insufficient documentation

## 2023-04-26 NOTE — Progress Notes (Signed)
Katrinka Blazing, M.D. Gastroenterology & Hepatology [facility] [address] [place], [place] [address]  Primary Care Physician: Suzan Slick, MD [address] [place] [address]  I will communicate my assessment and recommendations to the referring MD via EMR.  Problems: Gastroparesis GERD Esophageal web  History of Present Illness: KAYDAN WILHOITE is a 77 y.o. female with past medical history of GERD, Sjogren's syndrome , esophageal web /p dilation, gastroparesis , coming for follow up of GERD and gastroparesis.   The patient was last seen on 04/23/2022. At that time, the patient was scheduled for an EGD with findings described low.  She was continued on pantoprazole 40 mg in the morning and Pepcid at night.  Patient had a gastric emptying study on 05/29/2022 with findings described below: 6% emptied at 1 hr ( normal >= 10%) 21% emptied at 2 hr ( normal >= 40%  35% emptied at 3 hr ( normal >= 70%) 50% emptied at 4 hr ( normal >= 90%)  Patient was advised to eat smaller meals during the day as management of her gastroparesis.  Patient states she is having significant fullness after having meals. She is trying to eat the same amount as before. Reports she is having 2-3 small meals per day. For the last month she has felt food tastes bitter - she has not had any recent COVID or URIs. Weight has been stable. The patient denies having any nausea, vomiting, fever, chills, hematochezia, melena, hematemesis, abdominal pain, diarrhea, jaundice, pruritus.  She reports a history of constipation for the last year. Has a BM every other day. Takes Dulcolax twice a week.   She also endorses having "a hesitation when she swallows" as she has felt food does not go that easily and has to force swallows. No heartburn or odynophagia.   Last EGD: 04/2022 Normal esophagus, stomach and duodenum   Last Colonoscopy: 12/2019 - Two 5 mm polyps in the  transverse colon and at the hepatic flexure, removed with a cold snare. Resected and retrieved. - One small polyp in the distal transverse colon. Biopsied. - External and internal hemorrhoids.   Path: two TA and one lymph node   Repeat colonoscopy in 2026  Past Medical History: Past Medical History:  Diagnosis Date   Anxiety    Arthritis    Depression    GERD (gastroesophageal reflux disease)    Sjoegren syndrome     Past Surgical History: Past Surgical History:  Procedure Laterality Date   COLONOSCOPY N/A 01/11/2020   Procedure: COLONOSCOPY;  Surgeon: Malissa Hippo, MD;  [facility]: [facility];  Service: Endoscopy;  Laterality: N/A;   ESOPHAGOGASTRODUODENOSCOPY N/A 01/11/2020   Procedure: ESOPHAGOGASTRODUODENOSCOPY (EGD) WITH ESOPHAGEAL DILATION;  Surgeon: Malissa Hippo, MD;  Location: [facility];  Service: Endoscopy;  Laterality: N/A;  1:25   ESOPHAGOGASTRODUODENOSCOPY (EGD) WITH PROPOFOL N/A 05/22/2022   Procedure: ESOPHAGOGASTRODUODENOSCOPY (EGD) WITH PROPOFOL;  Surgeon: Dolores Frame, MD;  Location: [facility];  Service: Gastroenterology;  Laterality: N/A;  120   POLYPECTOMY  01/11/2020   Procedure: POLYPECTOMY;  Surgeon: Malissa Hippo, MD;  Location: [facility];  Service: Endoscopy;;  hepatic flexure,transverse colon   TOTAL ABDOMINAL HYSTERECTOMY      Family History: Family History  Problem Relation Age of Onset   Heart disease Mother    Diabetes Mother    Heart disease Father        "hole in his heart"  Lung cancer Brother    Asthma Brother    Heart disease Sister    Stroke Sister     Social History: Social History   Tobacco Use  Smoking Status Never  Smokeless Tobacco Never   Social History   Substance and Sexual Activity  Alcohol Use Yes   Comment: moderately - wine   Social History   Substance and Sexual Activity  Drug Use No    Allergies: Allergies  Allergen Reactions   Codeine Hives   Sulfa Antibiotics  Hives    Medications: Current Outpatient Medications  Medication Sig Dispense Refill   albuterol (VENTOLIN HFA) 108 (90 Base) MCG/ACT inhaler Inhale 2 puffs into the lungs every 6 (six) hours as needed for shortness of breath.     alendronate (FOSAMAX) 70 MG tablet Take 70 mg by mouth every Sunday. Take with a full glass of water on an empty stomach.     atorvastatin (LIPITOR) 10 MG tablet Take 10 mg by mouth every evening.      Biotin w/ Vitamins C & E (HAIR SKIN & NAILS GUMMIES PO) Take 2 tablets by mouth daily.     diazepam (VALIUM) 2 MG tablet Take 2 mg by mouth every 6 (six) hours as needed for anxiety. 1 mg daily     famotidine (PEPCID) 20 MG tablet Take 20 mg by mouth at bedtime.     FLUoxetine (PROZAC) 40 MG capsule Take 40 mg by mouth daily.     hydroxychloroquine (PLAQUENIL) 200 MG tablet Take 200 mg by mouth 2 (two) times daily before a meal.      ibuprofen (ADVIL,MOTRIN) 200 MG tablet Take 200 mg by mouth every 6 (six) hours as needed for pain.     loratadine (CLARITIN) 10 MG tablet Take 10 mg by mouth daily as needed for allergies.     Multiple Vitamins-Minerals (ADULT ONE DAILY GUMMIES PO) Take 2 tablets by mouth daily.     pantoprazole (PROTONIX) 40 MG tablet Take 1 tablet by mouth once daily 90 tablet 0   Polyethyl Glycol-Propyl Glycol (LUBRICANT EYE DROPS) 0.4-0.3 % SOLN Place 1 drop into both eyes 3 (three) times daily as needed (dry/irritated eyes.).     triamcinolone cream (KENALOG) 0.1 % Apply 1 application topically daily as needed (eczema (affected area(s) of feet)).      valACYclovir (VALTREX) 500 MG tablet Take 500 mg by mouth daily.     No current facility-administered medications for this visit.    Review of Systems: GENERAL: negative for malaise, night sweats HEENT: No changes in hearing or vision, no nose bleeds or other nasal problems. NECK: Negative for lumps, goiter, pain and significant neck swelling RESPIRATORY: Negative for cough,  wheezing CARDIOVASCULAR: Negative for chest pain, leg swelling, palpitations, orthopnea GI: SEE HPI MUSCULOSKELETAL: Negative for joint pain or swelling, back pain, and muscle pain. SKIN: Negative for lesions, rash PSYCH: Negative for sleep disturbance, mood disorder and recent psychosocial stressors. HEMATOLOGY Negative for prolonged bleeding, bruising easily, and swollen nodes. ENDOCRINE: Negative for cold or heat intolerance, polyuria, polydipsia and goiter. NEURO: negative for tremor, gait imbalance, syncope and seizures. The remainder of the review of systems is noncontributory.   Physical Exam: BP 123/74 (BP Location: Left Arm, Patient Position: Sitting, Cuff Size: Normal)   Pulse (!) 102   Temp (!) 97.1 F (36.2 C) (Temporal)   Ht 5\' 4"  (1.626 m)   Wt 157 lb 8 oz (71.4 kg)   BMI 27.03 kg/m  GENERAL: The patient is  AO x3, in no acute distress. HEENT: Head is normocephalic and atraumatic. EOMI are intact. Mouth is well hydrated and without lesions. NECK: Supple. No masses LUNGS: Clear to auscultation. No presence of rhonchi/wheezing/rales. Adequate chest expansion HEART: RRR, normal s1 and s2. ABDOMEN: Soft, nontender, no guarding, no peritoneal signs, and nondistended. BS +. No masses. EXTREMITIES: Without any cyanosis, clubbing, rash, lesions or edema. NEUROLOGIC: AOx3, no focal motor deficit. SKIN: no jaundice, no rashes  Imaging/Labs: as above  I personally reviewed and interpreted the available labs, imaging and endoscopic files.  Impression and Plan: LUAN URBANI is a 77 y.o. female with past medical history of GERD, Sjogren's syndrome , esophageal web /p dilation, gastroparesis , coming for follow up of GERD and gastroparesis.  Patient has presented persistent early satiety without other associated symptoms.  Has not presented any significant weight loss or other complaints.  We discussed her current diagnosis of gastroparesis and the possible pharmacologic agents to  treat including Reglan or domperidone.  The patient would like to hold off on this for now as she has been doing relatively well with dietary changes.  She should continue with these changes for now.    Her GERD symptoms have been well-controlled on pantoprazole and famotidine regimen, however she has presented some issues when swallowing.  Is unclear why she has presented this as her most recent EGD was unremarkable.  We will schedule a barium esophagram.  Depending on findings may consider doing an esophageal manometry.  Finally she is presenting some issues with constipation, I advised her to increase her MiraLAX intake.  -Try eating smaller meals  4-5 times per day -Continue pantoprazole 40 mg qday and famotidine at bedtime -Schedule barium esophagram with pill -Start taking Miralax 1 capful every day for one week. If bowel movements do not improve, increase to 2 capfuls every day. If after two weeks there is no improvement, increase to 2 capfuls in AM and one at night.  All questions were answered.      Katrinka Blazing, MD Gastroenterology and Hepatology [facility]

## 2023-04-26 NOTE — Patient Instructions (Addendum)
Try eating smaller meals  4-5 times per day Continue pantoprazole 40 mg qday and famotidine at bedtime Schedule barium esophagram with pill Start taking Miralax 1 capful every day for one week. If bowel movements do not improve, increase to 2 capfuls every day. If after two weeks there is no improvement, increase to 2 capfuls in AM and one at night.

## 2023-04-27 ENCOUNTER — Telehealth (INDEPENDENT_AMBULATORY_CARE_PROVIDER_SITE_OTHER): Payer: Self-pay | Admitting: Gastroenterology

## 2023-04-27 NOTE — Telephone Encounter (Signed)
Pt left message needing to know instructions for esophagram scheduled for Monday. Returned call to patient-pt needed to know time/date and instructions on what to do before exam. Went over appt time and instructions with pt.

## 2023-05-03 ENCOUNTER — Ambulatory Visit (HOSPITAL_COMMUNITY)
Admission: RE | Admit: 2023-05-03 | Discharge: 2023-05-03 | Disposition: A | Discharge status: Home / Self Care | Payer: PPO | Source: Ambulatory Visit | Attending: Gastroenterology | Admitting: Gastroenterology

## 2023-05-03 DIAGNOSIS — K219 Gastro-esophageal reflux disease without esophagitis: Secondary | ICD-10-CM | POA: Diagnosis not present

## 2023-05-03 DIAGNOSIS — R1314 Dysphagia, pharyngoesophageal phase: Secondary | ICD-10-CM | POA: Insufficient documentation

## 2023-06-30 ENCOUNTER — Other Ambulatory Visit (INDEPENDENT_AMBULATORY_CARE_PROVIDER_SITE_OTHER): Payer: Self-pay | Admitting: Gastroenterology

## 2023-06-30 DIAGNOSIS — K219 Gastro-esophageal reflux disease without esophagitis: Secondary | ICD-10-CM

## 2023-11-20 NOTE — Progress Notes (Unsigned)
Referring Provider: Catalina Lunger, DO Primary Care Physician:  Catalina Lunger, DO Primary GI Physician: Dr. Levon Hedger  Chief Complaint  Patient presents with   Diarrhea    Loose stools after she eats     HPI:   Kayla Berg is a 77 y.o. female with history of GERD, Sjogren's syndrome, esophageal web s/p dilation, gastroparesis, presenting today with chief complaint of postprandial bowel movements.  Last seen in the office 04/26/23.  She was advised to eat 4-5 smaller meals per day due to ongoing fullness.  Pantoprazole 40 mg daily and famotidine at bedtime were continued.  She was scheduled for barium pill esophagram for vague dysphagia.  She was started on MiraLAX for constipation.  Barium pill esophagram 05/03/2023 was unremarkable.  Today: About 2 weeks ago, started having bowel movements after eating. Passing small squiggly pieces of stool, sometimes balls of stool. Usually with 3 Bms a day, after meals. No nocturnal stools. No watery diarrhea. Passing sone mucous in her stool. No abdominal pain. Feels Bms are incomplete as she is actually passing only a small amount of stool. Passing gas, but not complete BM.   Prior to this, she was constipated.  She was drinking apple juice and taking dulcolax once a week or less. Would have a Bm twice a week.  MiraLAX wasn't helpful, but only took daily for a week.   No new medications.  No brbpr or melena.    Last EGD: 04/2022 Normal esophagus, stomach and duodenum   Last Colonoscopy: 12/2019 - Two 5 mm polyps in the transverse colon and at the hepatic flexure, removed with a cold snare. Resected and retrieved. - One small polyp in the distal transverse colon. Biopsied. - External and internal hemorrhoids. Path: two TA and one lymph node Repeat colonoscopy in 2026  Past Medical History:  Diagnosis Date   Anxiety    Arthritis    Depression    GERD (gastroesophageal reflux disease)    Sjoegren syndrome     Past Surgical History:   Procedure Laterality Date   COLONOSCOPY N/A 01/11/2020   Procedure: COLONOSCOPY;  Surgeon: Malissa Hippo, MD;  [facility];  Service: Endoscopy;  Laterality: N/A;   ESOPHAGOGASTRODUODENOSCOPY N/A 01/11/2020   Procedure: ESOPHAGOGASTRODUODENOSCOPY (EGD) WITH ESOPHAGEAL DILATION;  Surgeon: Malissa Hippo, MD;  [facility];  Service: Endoscopy;  Laterality: N/A;  1:25   ESOPHAGOGASTRODUODENOSCOPY (EGD) WITH PROPOFOL N/A 05/22/2022   Procedure: ESOPHAGOGASTRODUODENOSCOPY (EGD) WITH PROPOFOL;  Surgeon: Dolores Frame, MD;  [facility];  Service: Gastroenterology;  Laterality: N/A;  120   POLYPECTOMY  01/11/2020   Procedure: POLYPECTOMY;  Surgeon: Malissa Hippo, MD;  [facility];  Service: Endoscopy;;  hepatic flexure,transverse colon   TOTAL ABDOMINAL HYSTERECTOMY      Current Outpatient Medications  Medication Sig Dispense Refill   albuterol (VENTOLIN HFA) 108 (90 Base) MCG/ACT inhaler Inhale 2 puffs into the lungs every 6 (six) hours as needed for shortness of breath.     alendronate (FOSAMAX) 70 MG tablet Take 70 mg by mouth every Sunday. Take with a full glass of water on an empty stomach.     atorvastatin (LIPITOR) 10 MG tablet Take 10 mg by mouth every evening.      Biotin w/ Vitamins C & E (HAIR SKIN & NAILS GUMMIES PO) Take 2 tablets by mouth daily.     diazepam (VALIUM) 2 MG tablet Take 2 mg by mouth every 6 (six) hours  as needed for anxiety. 1 mg daily     FLUoxetine (PROZAC) 40 MG capsule Take 40 mg by mouth daily.     hydroxychloroquine (PLAQUENIL) 200 MG tablet Take 200 mg by mouth 2 (two) times daily before a meal.      ibuprofen (ADVIL,MOTRIN) 200 MG tablet Take 200 mg by mouth every 6 (six) hours as needed for pain.     Multiple Vitamins-Minerals (ADULT ONE DAILY GUMMIES PO) Take 2 tablets by mouth daily.     pantoprazole (PROTONIX) 20 MG tablet Take 20 mg by mouth 2 (two) times daily before a meal.     Polyethyl  Glycol-Propyl Glycol (LUBRICANT EYE DROPS) 0.4-0.3 % SOLN Place 1 drop into both eyes 3 (three) times daily as needed (dry/irritated eyes.).     polyethylene glycol powder (MIRALAX) 17 GM/SCOOP powder Take 17 g in 8 ounces of water 1-2 times daily. 765 g 1   valACYclovir (VALTREX) 500 MG tablet Take 500 mg by mouth daily.     triamcinolone cream (KENALOG) 0.1 % Apply 1 application topically daily as needed (eczema (affected area(s) of feet)).  (Patient not taking: Reported on 11/22/2023)     No current facility-administered medications for this visit.    Allergies as of 11/22/2023 - Review Complete 11/22/2023  Allergen Reaction Noted   Codeine Hives 05/05/2012   Sulfa antibiotics Hives 05/05/2012    Family History  Problem Relation Age of Onset   Heart disease Mother    Diabetes Mother    Heart disease Father        "hole in his heart"    Lung cancer Brother    Asthma Brother    Heart disease Sister    Stroke Sister     Social History   Socioeconomic History   Marital status: Widowed    Spouse name: Chrissie Noa   Number of children: 3   Years of education: 10th   Highest education level: Not on file  Occupational History   Occupation: retired  Tobacco Use   Smoking status: Never   Smokeless tobacco: Never  Vaping Use   Vaping status: Never Used  Substance and Sexual Activity   Alcohol use: Yes    Comment: moderately - wine   Drug use: No   Sexual activity: Not on file  Other Topics Concern   Not on file  Social History Narrative   Pt lives at home with spouse.    Caffeine Use: very little   Social Determinants of Health   Financial Resource Strain: Low Risk  (05/05/2023)   Received [facility]   Overall Financial Resource Strain (CARDIA)    Difficulty of Paying Living Expenses: Not hard at all  Food Insecurity: No Food Insecurity (11/18/2023)   Received from [facility]   Hunger Vital Sign    Worried About Running Out of Food in the  Last Year: Never true    Ran Out of Food in the Last Year: Never true  Transportation Needs: No Transportation Needs (11/18/2023)   Received from [facility] - Transportation    Lack of Transportation (Medical): No    Lack of Transportation (Non-Medical): No  Physical Activity: Insufficiently Active (08/18/2023)   Received from [facility]   Exercise Vital Sign    Days of Exercise per Week: 4 days    Minutes of Exercise per Session: 30 min  Stress: No Stress Concern Present (11/18/2023)   Received from [facility]  Care   [employer] of Occupational Health - Occupational Stress Questionnaire    Feeling of Stress : Not at all  Social Connections: Socially Integrated (08/18/2023)   Received from [facility]   Social Connection and Isolation Panel [NHANES]    Frequency of Communication with Friends and Family: Three times a week    Frequency of Social Gatherings with Friends and Family: Twice a week    Attends Religious Services: 1 to 4 times per year    Active Member of [employer] or Organizations: No    Attends Engineer, structural: 1 to 4 times per year    Marital Status: Married    Review of Systems: Gen: Denies fever, chills, cold or flulike symptoms, presyncope, syncope. GI: See HPI Heme: See HPI  Physical Exam: BP 137/77 (BP Location: Left Arm, Patient Position: Sitting, Cuff Size: Normal)   Pulse 87   Temp 97.6 F (36.4 C) (Temporal)   Ht 5\' 4"  (1.626 m)   Wt 159 lb 12.8 oz (72.5 kg)   BMI 27.43 kg/m  General:   Alert and oriented. No distress noted. Pleasant and cooperative.  Head:  Normocephalic and atraumatic. Eyes:  Conjuctiva clear without scleral icterus. Abdomen:  +BS, soft, non-tender and non-distended. No rebound or guarding. No HSM or masses noted. Msk:  Symmetrical without gross deformities. Normal posture. Neurologic:  Alert and  oriented x4 Psych:  Normal mood and affect.    Assessment:  77 y.o. female with history of  GERD, Sjogren's syndrome, esophageal web s/p dilation, gastroparesis, presenting today with chief complaint of postprandial bowel movements.  Clinically, sounds like patient is experiencing chronic, baseline constipation with overflow where she is now passing small amounts of stool after every meal.  No associated abdominal pain or alarm symptoms.  She is not currently taking anything to help with bowel regularity. Reports MiraLAX was not helpful though she only took this daily for 1 week.  Discussed trial of prescriptive agent versus retrying MiraLAX for a few weeks.  Ultimately, patient decided to retry MiraLAX.   Plan:  Start MiraLAX 17 g daily.  Advised that she can increase this to twice daily if needed. Requested progress report in 2-3 weeks. If MiraLAX not helpful, we will try a prescriptive agent. Follow-up in 3 months or sooner if needed.   Ermalinda Memos, PA-C [facility] Gastroenterology 11/22/2023   I have reviewed the note and agree with the APP's assessment as described in this progress note  Katrinka Blazing, MD Gastroenterology and Hepatology [facility] Gastroenterology

## 2023-11-22 ENCOUNTER — Encounter: Payer: Self-pay | Admitting: Gastroenterology

## 2023-11-22 ENCOUNTER — Ambulatory Visit: Payer: PPO | Admitting: Gastroenterology

## 2023-11-22 VITALS — BP 137/77 | HR 87 | Temp 97.6°F | Ht 64.0 in | Wt 159.8 lb

## 2023-11-22 DIAGNOSIS — K5909 Other constipation: Secondary | ICD-10-CM | POA: Diagnosis not present

## 2023-11-22 DIAGNOSIS — K5904 Chronic idiopathic constipation: Secondary | ICD-10-CM

## 2023-11-22 MED ORDER — POLYETHYLENE GLYCOL 3350 17 GM/SCOOP PO POWD: ORAL | 1 refills | Status: DC

## 2023-11-22 NOTE — Patient Instructions (Signed)
Start MiraLAX 1 capful (17 g) daily in 8 ounces of water or other noncarbonated beverage of your choice.  If this is not allowing you to have a bowel movement after 3 to 4 days, you can increase MiraLAX to twice daily.  After 2 to 3 weeks, please let me know how you are doing.  If MiraLAX is not working well for you, we can try a prescriptive agent.  I will plan to see back in the office in 3 months or sooner if needed.  It was nice to meet you today!  Ermalinda Memos, PA-C [facility] Gastroenterology

## 2024-02-03 IMAGING — NM NM GASTRIC EMPTYING
10 series · 10 of 10 positions shown · non-contrast
Comparison: None Available.

CLINICAL DATA: Early satiety.

EXAM:
NUCLEAR MEDICINE GASTRIC EMPTYING SCAN
TECHNIQUE: After oral ingestion of radiolabeled meal, sequential abdominal
images were obtained for 4 hours. Percentage of activity emptying
the stomach was calculated at 1 hour, 2 hour, 3 hour, and 4 hours.
RADIOPHARMACEUTICALS:  2.1 mCi Nc-XXm sulfur colloid in standardized
meal

[Series 1: 0 min · 4.14mm/px · 1 of 1 slices shown (1 of 2)]
[im 1/1]
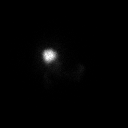

[Series 1: 0 min · 4.14mm/px · 1 of 1 slices shown (2 of 2)]
[im 1/1]
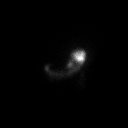

[Series 2: 60 min · 4.14mm/px · 1 of 1 slices shown (1 of 2)]
[im 1/1]
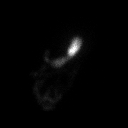

[Series 2: 60 min · 4.14mm/px · 1 of 1 slices shown (2 of 2)]
[im 1/1]
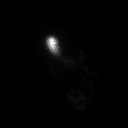

[Series 3: 120 min · 4.14mm/px · 1 of 1 slices shown (1 of 2)]
[im 1/1]
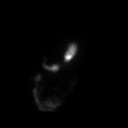

[Series 3: 120 min · 4.14mm/px · 1 of 1 slices shown (2 of 2)]
[im 1/1]
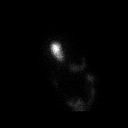

[Series 4: 180 min · 4.14mm/px · 1 of 1 slices shown (1 of 2)]
[im 1/1]
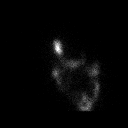

[Series 4: 180 min · 4.14mm/px · 1 of 1 slices shown (2 of 2)]
[im 1/1]
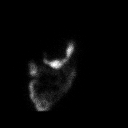

[Series 5: 240 min · 4.14mm/px · 1 of 1 slices shown (1 of 2)]
[im 1/1]
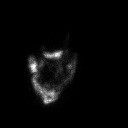

[Series 5: 240 min · 4.14mm/px · 1 of 1 slices shown (2 of 2)]
[im 1/1]
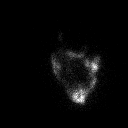

[10 of 10 positions shown; findings below may reference images not displayed]

FINDINGS: Expected location of the stomach in the left upper quadrant.
Ingested meal empties the stomach slowly and incompletely over the
course of the study.

6% emptied at 1 hr ( normal >= 10%)

21% emptied at 2 hr ( normal >= 40%)

35% emptied at 3 hr ( normal >= 70%)

50% emptied at 4 hr ( normal >= 90%)
IMPRESSION: Scintigraphic findings consistent with delayed gastric emptying.

## 2024-02-10 ENCOUNTER — Ambulatory Visit (INDEPENDENT_AMBULATORY_CARE_PROVIDER_SITE_OTHER): Payer: PPO | Admitting: Gastroenterology

## 2024-02-10 ENCOUNTER — Encounter (INDEPENDENT_AMBULATORY_CARE_PROVIDER_SITE_OTHER): Payer: Self-pay | Admitting: Gastroenterology

## 2024-02-10 VITALS — BP 121/70 | HR 92 | Temp 98.0°F | Ht 64.0 in | Wt 157.9 lb

## 2024-02-10 DIAGNOSIS — K581 Irritable bowel syndrome with constipation: Secondary | ICD-10-CM

## 2024-02-10 DIAGNOSIS — K3184 Gastroparesis: Secondary | ICD-10-CM

## 2024-02-10 DIAGNOSIS — K219 Gastro-esophageal reflux disease without esophagitis: Secondary | ICD-10-CM | POA: Diagnosis not present

## 2024-02-10 DIAGNOSIS — K589 Irritable bowel syndrome without diarrhea: Secondary | ICD-10-CM | POA: Insufficient documentation

## 2024-02-10 NOTE — Progress Notes (Addendum)
Kayla Berg, M.D. Gastroenterology & Hepatology [facility] Gastroenterology [address] [place], [place] [address]  Primary Care Physician: Kayla Lunger, DO [address] [place] [address]  I will communicate my assessment and recommendations to the referring MD via EMR.  Problems: IBS-C Gastroparesis GERD  History of Present Illness: Kayla Berg is a 78 y.o. female with past medical history of GERD, Sjogren's syndrome , esophageal web /p dilation, gastroparesis , IBS-C, who presents for follow up of GERD, gastroparesis and IBS-C.  The patient was last seen on 11/22/2023. At that time, the patient Was advised to start using MiraLAX on a regular basis.  Patient reports that she has multiple bowel movements a day, possibly 3-4 times  day. Consistency is watery to soft. She states she ahs to have a bowel movement every times she eats. She is having these symptoms for the last couple months. She has not noticed any difference since starting Miralax. States there is some mucus in stool but no blood. No melena or abdominal pain. She would like to have a larger bowel movement as she feels she is not having too much improvement and has urgency and tenesmus.  Has bloating when eating salads.  She tried Metamucil without significant improvement.  The patient denies having any nausea, vomiting, fever, chills, hematochezia, melena, hematemesis, abdominal distention, abdominal pain, jaundice, pruritus, heartburn, dysphagia or weight loss.  Patient had a gastric emptying study on 05/29/2022 with findings described below: 6% emptied at 1 hr ( normal >= 10%) 21% emptied at 2 hr ( normal >= 40%  35% emptied at 3 hr ( normal >= 70%) 50% emptied at 4 hr ( normal >= 90%)  Last EGD: 05/22/2022  normal esophagus, stomach and duodenum.  Last Colonoscopy: 12/2019 - Two 5 mm polyps in the transverse colon and at the hepatic flexure, removed with a cold snare. Resected and  retrieved. - One small polyp in the distal transverse colon. Biopsied. - External and internal hemorrhoids.   Path: two TA and one lymph node   Repeat colonoscopy in 2026  Past Medical History: Past Medical History:  Diagnosis Date   Anxiety    Arthritis    Depression    GERD (gastroesophageal reflux disease)    Sjoegren syndrome     Past Surgical History: Past Surgical History:  Procedure Laterality Date   COLONOSCOPY N/A 01/11/2020   Procedure: COLONOSCOPY;  Surgeon: Malissa Hippo, MD;  [facility];  Service: Endoscopy;  Laterality: N/A;   ESOPHAGOGASTRODUODENOSCOPY N/A 01/11/2020   Procedure: ESOPHAGOGASTRODUODENOSCOPY (EGD) WITH ESOPHAGEAL DILATION;  Surgeon: Malissa Hippo, MD;  [facility];  Service: Endoscopy;  Laterality: N/A;  1:25   ESOPHAGOGASTRODUODENOSCOPY (EGD) WITH PROPOFOL N/A 05/22/2022   Procedure: ESOPHAGOGASTRODUODENOSCOPY (EGD) WITH PROPOFOL;  Surgeon: Dolores Frame, MD;  [facility];  Service: Gastroenterology;  Laterality: N/A;  120   POLYPECTOMY  01/11/2020   Procedure: POLYPECTOMY;  Surgeon: Malissa Hippo, MD;  [facility];  Service: Endoscopy;;  hepatic flexure,transverse colon   TOTAL ABDOMINAL HYSTERECTOMY      Family History: Family History  Problem Relation Age of Onset   Heart disease Mother    Diabetes Mother    Heart disease Father        "hole in his heart"    Lung cancer Brother    Asthma Brother    Heart disease Sister    Stroke Sister     Social  History: Social History   Tobacco Use  Smoking Status Never  Smokeless Tobacco Never   Social History   Substance and Sexual Activity  Alcohol Use Yes   Comment: moderately - wine   Social History   Substance and Sexual Activity  Drug Use No    Allergies: Allergies  Allergen Reactions   Codeine Hives   Sulfa Antibiotics Hives    Medications: Current Outpatient Medications  Medication Sig Dispense Refill    albuterol (VENTOLIN HFA) 108 (90 Base) MCG/ACT inhaler Inhale 2 puffs into the lungs every 6 (six) hours as needed for shortness of breath.     alendronate (FOSAMAX) 70 MG tablet Take 70 mg by mouth every Sunday. Take with a full glass of water on an empty stomach.     atorvastatin (LIPITOR) 10 MG tablet Take 10 mg by mouth every evening.      Biotin w/ Vitamins C & E (HAIR SKIN & NAILS GUMMIES PO) Take 2 tablets by mouth daily.     diazepam (VALIUM) 2 MG tablet Take 2 mg by mouth every 6 (six) hours as needed for anxiety. 1 mg daily     FLUoxetine (PROZAC) 40 MG capsule Take 40 mg by mouth daily.     hydroxychloroquine (PLAQUENIL) 200 MG tablet Take 200 mg by mouth 2 (two) times daily before a meal.      ibuprofen (ADVIL,MOTRIN) 200 MG tablet Take 200 mg by mouth every 6 (six) hours as needed for pain.     Multiple Vitamins-Minerals (ADULT ONE DAILY GUMMIES PO) Take 2 tablets by mouth daily.     pantoprazole (PROTONIX) 20 MG tablet Take 20 mg by mouth 2 (two) times daily before a meal.     Polyethyl Glycol-Propyl Glycol (LUBRICANT EYE DROPS) 0.4-0.3 % SOLN Place 1 drop into both eyes 3 (three) times daily as needed (dry/irritated eyes.).     polyethylene glycol powder (MIRALAX) 17 GM/SCOOP powder Take 17 g in 8 ounces of water 1-2 times daily. 765 g 1   triamcinolone cream (KENALOG) 0.1 % Apply 1 application  topically daily as needed (eczema (affected area(s) of feet)).     valACYclovir (VALTREX) 500 MG tablet Take 500 mg by mouth daily.     No current facility-administered medications for this visit.    Review of Systems: GENERAL: negative for malaise, night sweats HEENT: No changes in hearing or vision, no nose bleeds or other nasal problems. NECK: Negative for lumps, goiter, pain and significant neck swelling RESPIRATORY: Negative for cough, wheezing CARDIOVASCULAR: Negative for chest pain, leg swelling, palpitations, orthopnea GI: SEE HPI MUSCULOSKELETAL: Negative for joint pain or  swelling, back pain, and muscle pain. SKIN: Negative for lesions, rash PSYCH: Negative for sleep disturbance, mood disorder and recent psychosocial stressors. HEMATOLOGY Negative for prolonged bleeding, bruising easily, and swollen nodes. ENDOCRINE: Negative for cold or heat intolerance, polyuria, polydipsia and goiter. NEURO: negative for tremor, gait imbalance, syncope and seizures. The remainder of the review of systems is noncontributory.   Physical Exam: BP 121/70 (BP Location: Left Arm, Patient Position: Sitting, Cuff Size: Large)   Pulse 92   Temp 98 F (36.7 C) (Temporal)   Ht 5\' 4"  (1.626 m)   Wt 157 lb 14.4 oz (71.6 kg)   BMI 27.10 kg/m  GENERAL: The patient is AO x3, in no acute distress. HEENT: Head is normocephalic and atraumatic. EOMI are intact. Mouth is well hydrated and without lesions. NECK: Supple. No masses LUNGS: Clear to auscultation. No presence of rhonchi/wheezing/rales.  Adequate chest expansion HEART: RRR, normal s1 and s2. ABDOMEN: Mildly tender upon palpation of the upper abdomen, no guarding, no peritoneal signs, and nondistended. BS +. No masses. EXTREMITIES: Without any cyanosis, clubbing, rash, lesions or edema. NEUROLOGIC: AOx3, no focal motor deficit. SKIN: no jaundice, no rashes  Imaging/Labs: as above  I personally reviewed and interpreted the available labs, imaging and endoscopic files.  Impression and Plan: Kayla Berg is a 78 y.o. female with past medical history of GERD, Sjogren's syndrome , esophageal web /p dilation, gastroparesis , IBS-C, who presents for follow up of GERD, gastroparesis and IBS-C.  The patient reports that she has had some sensation of  incomplete emptying and having frequent smaller bowel movements with different consistency while taking MiraLAX but not having significant improvement.  We discussed the possibility of trying a different type of  prescription laxative such as Ibsrela.  She will try some samples for now and  will let us know if this is helping improve her symptoms. if this does not lead to significant improvement, we will attempt to try other agent but we may also consider performing a diagnostic  Colonoscopy.    In terms of  Her GERD and gastroparesis, her symptoms appear to be controlled with dietary changes and with PPI twice daily at low-dose.  She will   Continue with this strategy for now.  -Continue eating small portions through the day -Continue pantoprazole 20 mg twice a day -Start Ibsrela 50 mg twice a day, try samples for a week -if there is improvement, please notify us to send a prescription for this -Stop MiraLAX  All questions were answered.      Kayla Blazing, MD Gastroenterology and Hepatology [facility] Gastroenterology

## 2024-02-10 NOTE — Patient Instructions (Addendum)
Continue eating small portions through the day Continue pantoprazole 20 mg twice a day Start Ibsrela 50 mg twice a day, try samples for a week -if there is improvement, please notify us to send a prescription for this Stop MiraLAX

## 2024-03-06 ENCOUNTER — Telehealth (INDEPENDENT_AMBULATORY_CARE_PROVIDER_SITE_OTHER): Payer: Self-pay

## 2024-03-06 ENCOUNTER — Other Ambulatory Visit (INDEPENDENT_AMBULATORY_CARE_PROVIDER_SITE_OTHER): Payer: Self-pay | Admitting: Gastroenterology

## 2024-03-06 DIAGNOSIS — K581 Irritable bowel syndrome with constipation: Secondary | ICD-10-CM

## 2024-03-06 MED ORDER — TRULANCE 3 MG PO TABS: 3.0000 mg | ORAL_TABLET | Freq: Every day | ORAL | 3 refills | Status: DC

## 2024-03-06 NOTE — Telephone Encounter (Signed)
 Ibsrela not working, would like something stronger. She says she is still having to strain to have a bm. Patient uses [facility]. Please advise.

## 2024-03-06 NOTE — Telephone Encounter (Signed)
 Please ask her to stop Allena Napoleon, I sent the prescription for Trulance daily.

## 2024-03-06 NOTE — Telephone Encounter (Signed)
 I spoke with the patient and made her aware to stop the IBSreala and aware to start Trulance.

## 2024-03-09 ENCOUNTER — Telehealth (INDEPENDENT_AMBULATORY_CARE_PROVIDER_SITE_OTHER): Payer: Self-pay | Admitting: *Deleted

## 2024-03-09 ENCOUNTER — Other Ambulatory Visit (INDEPENDENT_AMBULATORY_CARE_PROVIDER_SITE_OTHER): Payer: Self-pay | Admitting: *Deleted

## 2024-03-09 MED ORDER — LINACLOTIDE 72 MCG PO CAPS: 72.0000 ug | ORAL_CAPSULE | Freq: Every day | ORAL | 5 refills | Status: DC

## 2024-03-09 NOTE — Telephone Encounter (Signed)
 Pt notified rx changed to linzess and med sent to pharmacy.

## 2024-03-09 NOTE — Telephone Encounter (Signed)
 PA submitted for trulance and insurance denied med. Letter of denial states pt must try an fail at least one formulary alternative.   The formulary alternative is linzess. ( I do not see where she has tried this in the past. She had tried isbrella. Do you want to change med or can do appeal )

## 2024-03-09 NOTE — Telephone Encounter (Signed)
 Please send her Lizness 72 mcg qday Dx: IBS constipation Thanks

## 2024-04-06 ENCOUNTER — Telehealth (INDEPENDENT_AMBULATORY_CARE_PROVIDER_SITE_OTHER): Payer: Self-pay

## 2024-04-06 ENCOUNTER — Other Ambulatory Visit (INDEPENDENT_AMBULATORY_CARE_PROVIDER_SITE_OTHER): Payer: Self-pay | Admitting: Gastroenterology

## 2024-04-06 DIAGNOSIS — K581 Irritable bowel syndrome with constipation: Secondary | ICD-10-CM

## 2024-04-06 MED ORDER — LUBIPROSTONE 24 MCG PO CAPS: 24.0000 ug | ORAL_CAPSULE | Freq: Two times a day (BID) | ORAL | 3 refills | Status: DC

## 2024-04-06 NOTE — Telephone Encounter (Signed)
 I will send Amitiza 24 mcg twice a day, as it comes as a generic

## 2024-04-06 NOTE — Telephone Encounter (Signed)
 Patient still having issues with constipation on Linzess 72 mcg. She says she is still having to strain and only getting a small amount of stools out at a time. She says she would like to have something sent in that is cheaper, as she is having to pay a $50 co pay for this. I advised that We could possibly go up on the Linzess dose,but I was unsure if any of the other medications would be any cheaper. She has not tried [place] in the past, we have tried to get the Trulance approved in the past,but this was denied by the patient insurance. She uses [facility] in [place]. Please advise.

## 2024-04-06 NOTE — Telephone Encounter (Signed)
 I spoke with the patient and made her aware per Dr. Levon Hedger,  I will send Amitiza 24 mcg twice a day, as it comes as a generic  Patient states understanding.

## 2024-04-07 NOTE — Telephone Encounter (Signed)
 Patient called back this morning stating the Amitiza was too expensive it was $250.00. She would like to go back to the linzess at a higher dose patient was previously taking Linzess 72 mcg and this was not helpful. She uses [facility]. Please advise.

## 2024-04-07 NOTE — Telephone Encounter (Signed)
 Patient does have some 72 mcg Linzess left she was asked to try taking two to make 144 mcg she will try this for a few days and let us know how this is working for her and will let us know whether or not she would like for [place] to send in the Linzess 145 mcg to her pharmacy at that time.

## 2024-04-12 NOTE — Telephone Encounter (Signed)
 Can try going to the highest dose, which is 4 pills per day.  Please ask her to make a follow up appointment as well. Thanks

## 2024-04-12 NOTE — Telephone Encounter (Signed)
 Patient says she started doubling up on her Linzess 72 mcg to two pills per day (144 total) since 04/06/2024, and this has not helped.She says she has small Bm's daily since,but not much is coming out, other than Mucus. She reports she has the urge to go and has gas,but when she goes to the restroom, very little comes out. She is not taking Miralax, and the Amitiza was too expensive for her.Please advise.

## 2024-04-12 NOTE — Telephone Encounter (Signed)
 I spoke with the patient and made her aware per Dr. Sammi Crick, Can try going to the highest dose, which is 4 (72 mcg) pills per day. (Total of 288) Linzess. Please ask her to make a follow up appointment as well. Patient states understanding and was transferred to the appointment desk to schedule an appointment with Dr. Sammi Crick or an App.

## 2024-04-20 NOTE — Progress Notes (Unsigned)
 Referring Provider: Sheralyn Dies, FNP Primary Care Physician:  Sheralyn Dies, FNP Primary GI Physician: Dr. Sammi Crick  No chief complaint on file.   HPI:   Kayla Berg is a 78 y.o. female with history of GERD, Sjogren's syndrome, esophageal web s/p dilation, gastroparesis, IBS with constipation,***  Patient initially noted change in bowel habits at her office visit in November 2024.  Previously with bowel movements maybe twice a week taking Dulcolax and apple juice, but in November reported having small, incomplete postprandial bowel movements with some mucus in her stool. Recommended MiraLAX  daily at that time.   Last seen in the office 02/10/2024.  Reported having 3-4 BMs per day, postprandially, with consistency watery to soft. No improvement with MiraLAX .  Had some mucus in her stool, but no blood.  No melena, abdominal pain.  Stated she would like to have a larger bowel movement as she felt she was not having much improvement and had urgency and today's visit.  Had also previously tried Metamucil without significant improvement.  Recommended trial of Ibsrela.  If no significant improvement, can attempt to try another agent, but may also need to consider diagnostic colonoscopy.  Patient called 03/06/2024 reporting [name] was not helpful.  Having to strain with bowel movements.  She was prescribed Trulance , but this was not covered by insurance.  She was then prescribed Linzess  72 mcg.  Continue to have issues with constipation and was prescribed Amitiza  24 mcg twice daily on 04/06/2024.  This was too expensive.  Ultimately, patient increased up to 290 mcg of Linzess  on 4/16.    Today:     Last Colonoscopy: 12/2019 - Two 5 mm polyps in the transverse colon and at the hepatic flexure, removed with a cold snare. Resected and retrieved. - One small polyp in the distal transverse colon. Biopsied. - External and internal hemorrhoids.   Path: two TA and one lymph node   Repeat  colonoscopy in 2026  Past Medical History:  Diagnosis Date   Anxiety    Arthritis    Depression    GERD (gastroesophageal reflux disease)    Sjoegren syndrome     Past Surgical History:  Procedure Laterality Date   COLONOSCOPY N/A 01/11/2020   Procedure: COLONOSCOPY;  Surgeon: Ruby Corporal, MD;  [facility];  Service: Endoscopy;  Laterality: N/A;   ESOPHAGOGASTRODUODENOSCOPY N/A 01/11/2020   Procedure: ESOPHAGOGASTRODUODENOSCOPY (EGD) WITH ESOPHAGEAL DILATION;  Surgeon: Ruby Corporal, MD;  [facility];  Service: Endoscopy;  Laterality: N/A;  1:25   ESOPHAGOGASTRODUODENOSCOPY (EGD) WITH PROPOFOL  N/A 05/22/2022   Procedure: ESOPHAGOGASTRODUODENOSCOPY (EGD) WITH PROPOFOL ;  Surgeon: Urban Garden, MD;  [facility];  Service: Gastroenterology;  Laterality: N/A;  120   POLYPECTOMY  01/11/2020   Procedure: POLYPECTOMY;  Surgeon: Ruby Corporal, MD;  [facility];  Service: Endoscopy;;  hepatic flexure,transverse colon   TOTAL ABDOMINAL HYSTERECTOMY      Current Outpatient Medications  Medication Sig Dispense Refill   albuterol  (VENTOLIN  HFA) 108 (90 Base) MCG/ACT inhaler Inhale 2 puffs into the lungs every 6 (six) hours as needed for shortness of breath.     alendronate (FOSAMAX) 70 MG tablet Take 70 mg by mouth every Sunday. Take with a full glass of water  on an empty stomach.     atorvastatin (LIPITOR) 10 MG tablet Take 10 mg by mouth every evening.      Biotin w/ Vitamins C & E (HAIR SKIN & NAILS  GUMMIES PO) Take 2 tablets by mouth daily.     diazepam (VALIUM) 2 MG tablet Take 2 mg by mouth every 6 (six) hours as needed for anxiety. 1 mg daily     FLUoxetine (PROZAC) 40 MG capsule Take 40 mg by mouth daily.     hydroxychloroquine (PLAQUENIL) 200 MG tablet Take 200 mg by mouth 2 (two) times daily before a meal.      ibuprofen (ADVIL,MOTRIN) 200 MG tablet Take 200 mg by mouth every 6 (six) hours as needed for pain.      lubiprostone  (AMITIZA ) 24 MCG capsule Take 1 capsule (24 mcg total) by mouth 2 (two) times daily with a meal. 180 capsule 3   Multiple Vitamins-Minerals (ADULT ONE DAILY GUMMIES PO) Take 2 tablets by mouth daily.     pantoprazole  (PROTONIX ) 20 MG tablet Take 20 mg by mouth 2 (two) times daily before a meal.     Polyethyl Glycol-Propyl Glycol (LUBRICANT EYE DROPS) 0.4-0.3 % SOLN Place 1 drop into both eyes 3 (three) times daily as needed (dry/irritated eyes.).     polyethylene glycol powder (MIRALAX ) 17 GM/SCOOP powder Take 17 g in 8 ounces of water  1-2 times daily. 765 g 1   triamcinolone cream (KENALOG) 0.1 % Apply 1 application  topically daily as needed (eczema (affected area(s) of feet)).     valACYclovir (VALTREX) 500 MG tablet Take 500 mg by mouth daily.     No current facility-administered medications for this visit.    Allergies as of 04/21/2024 - Review Complete 02/10/2024  Allergen Reaction Noted   Codeine Hives 05/05/2012   Sulfa antibiotics Hives 05/05/2012    Family History  Problem Relation Age of Onset   Heart disease Mother    Diabetes Mother    Heart disease Father        "hole in his heart"    Lung cancer Brother    Asthma Brother    Heart disease Sister    Stroke Sister     Social History   Socioeconomic History   Marital status: Widowed    Spouse name: Sammie Crigler   Number of children: 3   Years of education: 10th   Highest education level: Not on file  Occupational History   Occupation: retired  Tobacco Use   Smoking status: Never   Smokeless tobacco: Never  Vaping Use   Vaping status: Never Used  Substance and Sexual Activity   Alcohol use: Yes    Comment: moderately - wine   Drug use: No   Sexual activity: Not on file  Other Topics Concern   Not on file  Social History Narrative   Pt lives at home with spouse.    Caffeine Use: very little   Social Drivers of Corporate investment banker Strain: Low Risk  (05/05/2023)   Received [facility]   Overall Financial Resource Strain (CARDIA)    Difficulty of Paying Living Expenses: Not hard at all  Food Insecurity: No Food Insecurity (01/06/2024)   Received from [facility]   Hunger Vital Sign    Worried About Running Out of Food in the Last Year: Never true    Ran Out of Food in the Last Year: Never true  Transportation Needs: No Transportation Needs (01/06/2024)   Received from [facility] - Transportation    Lack of Transportation (Medical): No    Lack of Transportation (Non-Medical): No  Physical Activity: Insufficiently Active (08/18/2023)  Received from [facility]   Exercise Vital Sign    Days of Exercise per Week: 4 days    Minutes of Exercise per Session: 30 min  Stress: No Stress Concern Present (01/06/2024)   Received from [facility] - Occupational Stress Questionnaire    Feeling of Stress : Not at all  Social Connections: Moderately Integrated (08/18/2023)   Received from [facility]   Social Connection and Isolation Panel [NHANES]    Frequency of Communication with Friends and Family: Three times a week    Frequency of Social Gatherings with Friends and Family: Twice a week    Attends Religious Services: 1 to 4 times per year    Active Member of [employer] or Organizations: No    Attends Engineer, structural: Not on file    Marital Status: Married    Review of Systems: Gen: Denies fever, chills, anorexia. Denies fatigue, weakness, weight loss.  CV: Denies chest pain, palpitations, syncope, peripheral edema, and claudication. Resp: Denies dyspnea at rest, cough, wheezing, coughing up blood, and pleurisy. GI: Denies vomiting blood, jaundice, and fecal incontinence.   Denies dysphagia or odynophagia. Derm: Denies rash, itching, dry skin Psych: Denies depression, anxiety, memory loss, confusion. No homicidal or suicidal ideation.  Heme: Denies bruising, bleeding, and  enlarged lymph nodes.  Physical Exam: There were no vitals taken for this visit. General:   Alert and oriented. No distress noted. Pleasant and cooperative.  Head:  Normocephalic and atraumatic. Eyes:  Conjuctiva clear without scleral icterus. Heart:  S1, S2 present without murmurs appreciated. Lungs:  Clear to auscultation bilaterally. No wheezes, rales, or rhonchi. No distress.  Abdomen:  +BS, soft, non-tender and non-distended. No rebound or guarding. No HSM or masses noted. Msk:  Symmetrical without gross deformities. Normal posture. Extremities:  Without edema. Neurologic:  Alert and  oriented x4 Psych:  Normal mood and affect.    Assessment:     Plan:  ***   Shana Daring, PA-C [facility] 04/21/2024

## 2024-04-21 ENCOUNTER — Ambulatory Visit: Admitting: Gastroenterology

## 2024-04-21 ENCOUNTER — Encounter: Payer: Self-pay | Admitting: Gastroenterology

## 2024-04-21 VITALS — BP 104/63 | HR 82 | Temp 97.6°F | Ht 64.0 in | Wt 156.8 lb

## 2024-04-21 DIAGNOSIS — K581 Irritable bowel syndrome with constipation: Secondary | ICD-10-CM

## 2024-04-21 DIAGNOSIS — K5909 Other constipation: Secondary | ICD-10-CM

## 2024-04-21 DIAGNOSIS — R194 Change in bowel habit: Secondary | ICD-10-CM

## 2024-04-21 MED ORDER — LINACLOTIDE 290 MCG PO CAPS: 290.0000 ug | ORAL_CAPSULE | Freq: Every day | ORAL | 5 refills | Status: AC

## 2024-04-21 NOTE — Patient Instructions (Addendum)
 Start taking Linzess  290 mcg daily, first thing in the morning, 30 minutes before breakfast.  Let me know if this higher dose of Linzess  is not working well for you and I will provide additional recommendations.  We will get you scheduled for a colonoscopy with Dr. Sammi Crick.  I will plan to see you back in the office after your colonoscopy.  It was good to see you today!  Shana Daring, PA-C [facility]astroenter[facility]

## 2024-04-25 ENCOUNTER — Ambulatory Visit (INDEPENDENT_AMBULATORY_CARE_PROVIDER_SITE_OTHER): Payer: PPO | Admitting: Gastroenterology

## 2024-05-04 ENCOUNTER — Encounter: Payer: Self-pay | Admitting: *Deleted

## 2024-05-04 ENCOUNTER — Other Ambulatory Visit: Payer: Self-pay | Admitting: *Deleted

## 2024-05-04 MED ORDER — PEG 3350-KCL-NA BICARB-NACL 420 G PO SOLR: 4000.0000 mL | Freq: Once | ORAL | 0 refills | Status: AC

## 2024-06-06 NOTE — Anesthesia Preprocedure Evaluation (Signed)
 Anesthesia Evaluation  Patient identified by MRN, date of birth, ID band Patient awake    Reviewed: Allergy & Precautions, NPO status , Patient's Chart, lab work & pertinent test results  Airway Mallampati: II  TM Distance: >3 FB Neck ROM: Full    Dental  (+) Dental Advisory Given, Caps   Pulmonary neg pulmonary ROS   Pulmonary exam normal breath sounds clear to auscultation       Cardiovascular Exercise Tolerance: Good + DOE  Normal cardiovascular exam Rhythm:Regular Rate:Normal     Neuro/Psych  PSYCHIATRIC DISORDERS Anxiety Depression    negative neurological ROS     GI/Hepatic ,GERD  Medicated and Controlled,,(+)     substance abuse  alcohol use  Endo/Other  negative endocrine ROS    Renal/GU negative Renal ROS  negative genitourinary   Musculoskeletal  (+) Arthritis ,    Abdominal   Peds negative pediatric ROS (+)  Hematology negative hematology ROS (+)   Anesthesia Other Findings Sjogren's syndrome  Reproductive/Obstetrics negative OB ROS                             Anesthesia Physical Anesthesia Plan  ASA: 2  Anesthesia Plan: General   Post-op Pain Management: Minimal or no pain anticipated   Induction: Intravenous  PONV Risk Score and Plan: Propofol  infusion  Airway Management Planned: Nasal Cannula and Natural Airway  Additional Equipment: None  Intra-op Plan:   Post-operative Plan:   Informed Consent: I have reviewed the patients History and Physical, chart, labs and discussed the procedure including the risks, benefits and alternatives for the proposed anesthesia with the patient or authorized representative who has indicated his/her understanding and acceptance.     Dental advisory given  Plan Discussed with: CRNA  Anesthesia Plan Comments:         Anesthesia Quick Evaluation

## 2024-06-07 ENCOUNTER — Ambulatory Visit (HOSPITAL_COMMUNITY)
Admission: RE | Admit: 2024-06-07 | Discharge: 2024-06-07 | Disposition: A | Discharge status: Home / Self Care | Attending: Gastroenterology | Admitting: Gastroenterology

## 2024-06-07 ENCOUNTER — Other Ambulatory Visit: Payer: Self-pay

## 2024-06-07 ENCOUNTER — Encounter (HOSPITAL_COMMUNITY)
Admission: RE | Disposition: A | Discharge status: Home / Self Care | Payer: Self-pay | Source: Home / Self Care | Attending: Gastroenterology

## 2024-06-07 ENCOUNTER — Ambulatory Visit (HOSPITAL_COMMUNITY): Payer: Self-pay | Admitting: Anesthesiology

## 2024-06-07 ENCOUNTER — Encounter (HOSPITAL_COMMUNITY): Payer: Self-pay | Admitting: Gastroenterology

## 2024-06-07 DIAGNOSIS — K219 Gastro-esophageal reflux disease without esophagitis: Secondary | ICD-10-CM | POA: Insufficient documentation

## 2024-06-07 DIAGNOSIS — K648 Other hemorrhoids: Secondary | ICD-10-CM | POA: Diagnosis not present

## 2024-06-07 DIAGNOSIS — K5900 Constipation, unspecified: Secondary | ICD-10-CM | POA: Diagnosis present

## 2024-06-07 DIAGNOSIS — F32A Depression, unspecified: Secondary | ICD-10-CM | POA: Diagnosis not present

## 2024-06-07 DIAGNOSIS — D120 Benign neoplasm of cecum: Secondary | ICD-10-CM | POA: Insufficient documentation

## 2024-06-07 DIAGNOSIS — K3184 Gastroparesis: Secondary | ICD-10-CM | POA: Insufficient documentation

## 2024-06-07 DIAGNOSIS — R194 Change in bowel habit: Secondary | ICD-10-CM | POA: Diagnosis present

## 2024-06-07 DIAGNOSIS — D122 Benign neoplasm of ascending colon: Secondary | ICD-10-CM

## 2024-06-07 DIAGNOSIS — K5730 Diverticulosis of large intestine without perforation or abscess without bleeding: Secondary | ICD-10-CM | POA: Diagnosis not present

## 2024-06-07 DIAGNOSIS — F419 Anxiety disorder, unspecified: Secondary | ICD-10-CM | POA: Insufficient documentation

## 2024-06-07 DIAGNOSIS — M3500 Sicca syndrome, unspecified: Secondary | ICD-10-CM | POA: Insufficient documentation

## 2024-06-07 HISTORY — PX: COLONOSCOPY: SHX5424

## 2024-06-07 LAB — HM COLONOSCOPY

## 2024-06-07 SURGERY — COLONOSCOPY
Anesthesia: General

## 2024-06-07 MED ORDER — LACTATED RINGERS IV SOLN
INTRAVENOUS | Status: DC
  Administered 2024-06-07: 500 mL via INTRAVENOUS

## 2024-06-07 MED ORDER — PROPOFOL 10 MG/ML IV BOLUS
INTRAVENOUS | Status: DC | PRN
  Administered 2024-06-07: 125 ug/kg/min via INTRAVENOUS
  Administered 2024-06-07: 60 mg via INTRAVENOUS
  Administered 2024-06-07: 20 mg via INTRAVENOUS

## 2024-06-07 MED ORDER — LIDOCAINE 2% (20 MG/ML) 5 ML SYRINGE
INTRAMUSCULAR | Status: DC | PRN
Start: 2024-06-07 — End: 2024-06-07
  Administered 2024-06-07: 40 mg via INTRAVENOUS

## 2024-06-07 NOTE — H&P (Signed)
 Kayla Berg is an 78 y.o. female.   Chief Complaint: constipation HPI: Kayla Berg is a 78 y.o. female with history of GERD, Sjogren's syndrome, esophageal web s/p dilation, gastroparesis, chronic constipation, presenting today for evaluation of constipation.   Was presenting significant constipation but states for last 2 weeks her bowel habits have been normal.  The patient denies having any nausea, vomiting, fever, chills, hematochezia, melena, hematemesis, abdominal distention, abdominal pain, diarrhea, jaundice, pruritus or weight loss.   Past Medical History:  Diagnosis Date   Anxiety    Arthritis    Depression    GERD (gastroesophageal reflux disease)    Sjoegren syndrome     Past Surgical History:  Procedure Laterality Date   COLONOSCOPY N/A 01/11/2020   Procedure: COLONOSCOPY;  Surgeon: Ruby Corporal, MD;  [facility];  Service: Endoscopy;  Laterality: N/A;   ESOPHAGOGASTRODUODENOSCOPY N/A 01/11/2020   Procedure: ESOPHAGOGASTRODUODENOSCOPY (EGD) WITH ESOPHAGEAL DILATION;  Surgeon: Ruby Corporal, MD;  [facility];  Service: Endoscopy;  Laterality: N/A;  1:25   ESOPHAGOGASTRODUODENOSCOPY (EGD) WITH PROPOFOL  N/A 05/22/2022   Procedure: ESOPHAGOGASTRODUODENOSCOPY (EGD) WITH PROPOFOL ;  Surgeon: Urban Garden, MD;  [facility];  Service: Gastroenterology;  Laterality: N/A;  120   POLYPECTOMY  01/11/2020   Procedure: POLYPECTOMY;  Surgeon: Ruby Corporal, MD;  [facility];  Service: Endoscopy;;  hepatic flexure,transverse colon   TOTAL ABDOMINAL HYSTERECTOMY      Family History  Problem Relation Age of Onset   Heart disease Mother    Diabetes Mother    Heart disease Father        hole in his heart    Lung cancer Brother    Asthma Brother    Heart disease Sister    Stroke Sister    Social History:  reports that she has never smoked. She has never used smokeless tobacco. She reports that she does not currently use  alcohol. She reports that she does not use drugs.  Allergies:  Allergies  Allergen Reactions   Codeine Hives   Sulfa Antibiotics Hives    Medications Prior to Admission  Medication Sig Dispense Refill   albuterol  (VENTOLIN  HFA) 108 (90 Base) MCG/ACT inhaler Inhale 2 puffs into the lungs every 6 (six) hours as needed for shortness of breath.     alendronate (FOSAMAX) 70 MG tablet Take 70 mg by mouth every Sunday. Take with a full glass of water  on an empty stomach.     atorvastatin (LIPITOR) 10 MG tablet Take 10 mg by mouth every evening.      Biotin w/ Vitamins C & E (HAIR SKIN & NAILS GUMMIES PO) Take 2 tablets by mouth daily.     diazepam (VALIUM) 2 MG tablet Take 2 mg by mouth every 6 (six) hours as needed for anxiety. 1 mg daily     FLUoxetine (PROZAC) 40 MG capsule Take 40 mg by mouth daily.     hydroxychloroquine (PLAQUENIL) 200 MG tablet Take 200 mg by mouth 2 (two) times daily before a meal.      ibuprofen (ADVIL,MOTRIN) 200 MG tablet Take 200 mg by mouth every 6 (six) hours as needed for pain.     linaclotide  (LINZESS ) 290 MCG CAPS capsule Take 1 capsule (290 mcg total) by mouth daily before breakfast. 30 capsule 5   Multiple Vitamins-Minerals (ADULT ONE DAILY GUMMIES PO) Take 2 tablets by mouth daily.     pantoprazole  (PROTONIX ) 20 MG tablet Take 20  mg by mouth 2 (two) times daily before a meal.     Polyethyl Glycol-Propyl Glycol (LUBRICANT EYE DROPS) 0.4-0.3 % SOLN Place 1 drop into both eyes 3 (three) times daily as needed (dry/irritated eyes.).     valACYclovir (VALTREX) 500 MG tablet Take 500 mg by mouth daily.     triamcinolone cream (KENALOG) 0.1 % Apply 1 application  topically daily as needed (eczema (affected area(s) of feet)).      No results found for this or any previous visit (from the past 48 hours). No results found.  Review of Systems  Gastrointestinal:  Positive for constipation.  All other systems reviewed and are negative.   Blood pressure 134/62, pulse  81, temperature 98.2 F (36.8 C), temperature source Oral, resp. rate 15, height 5' 4 (1.626 m), weight 70.3 kg, SpO2 95%. Physical Exam  GENERAL: The patient is AO x3, in no acute distress. HEENT: Head is normocephalic and atraumatic. EOMI are intact. Mouth is well hydrated and without lesions. NECK: Supple. No masses LUNGS: Clear to auscultation. No presence of rhonchi/wheezing/rales. Adequate chest expansion HEART: RRR, normal s1 and s2. ABDOMEN: Soft, nontender, no guarding, no peritoneal signs, and nondistended. BS +. No masses. EXTREMITIES: Without any cyanosis, clubbing, rash, lesions or edema. NEUROLOGIC: AOx3, no focal motor deficit. SKIN: no jaundice, no rashes  Assessment/Plan  Kayla Berg is a 78 y.o. female with history of GERD, Sjogren's syndrome, esophageal web s/p dilation, gastroparesis, chronic constipation, presenting today for evaluation of constipation.  Will proceed with colonoscopy.  Urban Garden, MD 06/07/2024, 8:20 AM

## 2024-06-07 NOTE — Op Note (Signed)
 [facility] Patient Name: Kayla Berg Procedure Date: 06/07/2024 7:53 AM MRN: [id number] Date of Birth: 10-Jan-1946 Attending MD: Samantha Cress , , [id number] CSN: [id number] Age: 78 Admit Type: Outpatient Procedure:                Colonoscopy Indications:              Change in bowel habits Providers:                Samantha Cress, Willena Harp, Sharlette Dayhoff                            Technician, Technician Referring MD:              Medicines:                Monitored Anesthesia Care Complications:            No immediate complications. Estimated Blood Loss:     Estimated blood loss: none. Procedure:                Pre-Anesthesia Assessment:                           - Prior to the procedure, a History and Physical                            was performed, and patient medications, allergies                            and sensitivities were reviewed. The patient's                            tolerance of previous anesthesia was reviewed.                           - The risks and benefits of the procedure and the                            sedation options and risks were discussed with the                            patient. All questions were answered and informed                            consent was obtained.                           - ASA Grade Assessment: II - A patient with mild                            systemic disease.                           After obtaining informed consent, the colonoscope                            was passed under direct vision. Throughout the  procedure, the patient's blood pressure, pulse, and                            oxygen saturations were monitored continuously. The                            [id number]-HQ190L ([id number]) scope was introduced through                            the anus and advanced to the the cecum, identified                            by appendiceal orifice and ileocecal valve. The                             colonoscopy was performed without difficulty. The                            patient tolerated the procedure well. The quality                            of the bowel preparation was good. Scope In: 8:35:38 AM Scope Out: 9:00:54 AM Scope Withdrawal Time: 0 hours 16 minutes 16 seconds  Total Procedure Duration: 0 hours 25 minutes 16 seconds  Findings:      The perianal and digital rectal examinations were normal.      A 2 mm polyp was found in the cecum. The polyp was sessile. The polyp       was removed with a cold biopsy forceps. Resection and retrieval were       complete.      Two sessile polyps were found in the ascending colon and cecum. The       polyps were 4 to 5 mm in size. These polyps were removed with a cold       snare. Resection and retrieval were complete.      A few medium-mouthed diverticula were found in the ascending colon.      Non-bleeding internal hemorrhoids were found during retroflexion. The       hemorrhoids were small. Impression:               - One 2 mm polyp in the cecum, removed with a cold                            biopsy forceps. Resected and retrieved.                           - Two 4 to 5 mm polyps in the ascending colon and                            in the cecum, removed with a cold snare. Resected                            and retrieved.                           -  Diverticulosis in the ascending colon.                           - Non-bleeding internal hemorrhoids. Moderate Sedation:      Per Anesthesia Care Recommendation:           - Discharge patient to home (ambulatory).                           - Resume previous diet.                           - Await pathology results.                           - Repeat colonoscopy in 5 years for surveillance. Procedure Code(s):        --- Professional ---                           [phone], Colonoscopy, flexible; with removal of                            tumor(s), polyp(s), or other lesion(s)  by snare                            technique                           45380, 59, Colonoscopy, flexible; with biopsy,                            single or multiple Diagnosis Code(s):        --- Professional ---                           D12.0, Benign neoplasm of cecum                           D12.2, Benign neoplasm of ascending colon                           K64.8, Other hemorrhoids                           R19.4, Change in bowel habit                           K57.30, Diverticulosis of large intestine without                            perforation or abscess without bleeding CPT copyright 2022 American Medical Association. All rights reserved. The codes documented in this report are preliminary and upon coder review may  be revised to meet current compliance requirements. Samantha Cress, MD Samantha Cress,  06/07/2024 9:08:21 AM This report has been signed electronically. Number of Addenda: 0

## 2024-06-07 NOTE — Discharge Instructions (Signed)
You are being discharged to home.  Resume your previous diet.  We are waiting for your pathology results.  Your physician has recommended a repeat colonoscopy in five years for surveillance.  

## 2024-06-07 NOTE — Transfer of Care (Signed)
 Immediate Anesthesia Transfer of Care Note  Patient: Kayla Berg  Procedure(s) Performed: COLONOSCOPY  Patient Location: Endoscopy Unit  Anesthesia Type:General  Level of Consciousness: drowsy and patient cooperative  Airway & Oxygen Therapy: Patient Spontanous Breathing and Patient connected to nasal cannula oxygen  Post-op Assessment: Report given to RN and Post -op Vital signs reviewed and stable  Post vital signs: Reviewed and stable  Last Vitals:  Vitals Value Taken Time  BP 104/85 06/07/24 0910  Temp 36.8 C 06/07/24 0905  Pulse 81 06/07/24 0910  Resp 15 06/07/24 0910  SpO2 100 % 06/07/24 0910    Last Pain:  Vitals:   06/07/24 0905  TempSrc: Oral  PainSc: 0-No pain      Patients Stated Pain Goal: (P) 5 (06/07/24 0704)  Complications: No notable events documented.

## 2024-06-07 NOTE — Anesthesia Postprocedure Evaluation (Signed)
 Anesthesia Post Note  Patient: Kayla Berg  Procedure(s) Performed: COLONOSCOPY  Patient location during evaluation: Endoscopy Anesthesia Type: General Level of consciousness: awake and alert Pain management: pain level controlled Vital Signs Assessment: post-procedure vital signs reviewed and stable Respiratory status: spontaneous breathing, nonlabored ventilation and respiratory function stable Cardiovascular status: blood pressure returned to baseline and stable Postop Assessment: no apparent nausea or vomiting Anesthetic complications: no   There were no known notable events for this encounter.   Last Vitals:  Vitals:   06/07/24 0905 06/07/24 0910  BP: (!) 116/47 104/85  Pulse: 81 81  Resp: 15 15  Temp: 36.8 C   SpO2: 99% 100%    Last Pain:  Vitals:   06/07/24 0905  TempSrc: Oral  PainSc: 0-No pain                 Deyonte Cadden L Jessicca Stitzer

## 2024-06-07 NOTE — Addendum Note (Signed)
 Addendum  created 06/07/24 1034 by Juluis Ok, CRNA   Intraprocedure Meds edited

## 2024-06-08 ENCOUNTER — Encounter (INDEPENDENT_AMBULATORY_CARE_PROVIDER_SITE_OTHER): Payer: Self-pay | Admitting: *Deleted

## 2024-06-08 ENCOUNTER — Encounter (HOSPITAL_COMMUNITY): Payer: Self-pay | Admitting: Gastroenterology

## 2024-06-08 ENCOUNTER — Ambulatory Visit (INDEPENDENT_AMBULATORY_CARE_PROVIDER_SITE_OTHER): Payer: Self-pay | Admitting: Gastroenterology

## 2024-06-08 LAB — SURGICAL PATHOLOGY

## 2024-07-28 ENCOUNTER — Encounter (INDEPENDENT_AMBULATORY_CARE_PROVIDER_SITE_OTHER): Payer: Self-pay | Admitting: Gastroenterology

## 2024-11-01 ENCOUNTER — Encounter (INDEPENDENT_AMBULATORY_CARE_PROVIDER_SITE_OTHER): Payer: Self-pay | Admitting: Gastroenterology
